# Patient Record
Sex: Male | Born: 1970 | Race: White | Hispanic: No | Marital: Married | Smoking: Former smoker
Health system: Southern US, Community
[De-identification: ages and names within clinical notes are randomized; demographics above are authoritative.]

## PROBLEM LIST (undated history)

## (undated) DIAGNOSIS — C189 Malignant neoplasm of colon, unspecified: Secondary | ICD-10-CM

## (undated) DIAGNOSIS — K219 Gastro-esophageal reflux disease without esophagitis: Secondary | ICD-10-CM

## (undated) DIAGNOSIS — I10 Essential (primary) hypertension: Secondary | ICD-10-CM

## (undated) DIAGNOSIS — R569 Unspecified convulsions: Secondary | ICD-10-CM

## (undated) HISTORY — DX: Unspecified convulsions: R56.9

## (undated) HISTORY — DX: Essential (primary) hypertension: I10

## (undated) HISTORY — PX: COLONOSCOPY: SHX174

## (undated) HISTORY — DX: Malignant neoplasm of colon, unspecified: C18.9

## (undated) HISTORY — PX: TONSILLECTOMY: SUR1361

---

## 2012-07-31 ENCOUNTER — Encounter: Payer: Self-pay | Admitting: *Deleted

## 2012-08-19 ENCOUNTER — Ambulatory Visit (INDEPENDENT_AMBULATORY_CARE_PROVIDER_SITE_OTHER): Payer: BC Managed Care – PPO | Admitting: Family Medicine

## 2012-08-19 ENCOUNTER — Encounter: Payer: Self-pay | Admitting: Family Medicine

## 2012-08-19 VITALS — BP 126/90 | HR 86 | Temp 98.3°F | Resp 18 | Wt 274.0 lb

## 2012-08-19 DIAGNOSIS — E669 Obesity, unspecified: Secondary | ICD-10-CM

## 2012-08-19 DIAGNOSIS — I10 Essential (primary) hypertension: Secondary | ICD-10-CM

## 2012-08-19 LAB — LIPID PANEL
Cholesterol: 191 mg/dL (ref 0–200)
HDL: 40 mg/dL (ref >39)
LDL Cholesterol: 127 mg/dL — ABNORMAL HIGH (ref 0–99)
Total CHOL/HDL Ratio: 4.8 Ratio
Triglycerides: 122 mg/dL (ref <150)
VLDL: 24 mg/dL (ref 0–40)

## 2012-08-19 LAB — BASIC METABOLIC PANEL
BUN: 15 mg/dL (ref 6–23)
Chloride: 103 mEq/L (ref 96–112)
Creat: 1.09 mg/dL (ref 0.50–1.35)

## 2012-08-19 LAB — HEPATIC FUNCTION PANEL
Bilirubin, Direct: 0.1 mg/dL (ref 0.0–0.3)
Indirect Bilirubin: 0.4 mg/dL (ref 0.0–0.9)
Total Bilirubin: 0.5 mg/dL (ref 0.3–1.2)

## 2012-08-19 NOTE — Progress Notes (Signed)
Subjective:     Patient ID: Russell Novak, male   DOB: September 28, 1970, 42 y.o.   MRN: [id number]  HPI  42 year old male here for followup for hypertension.  Early taking losartan 50 mg by mouth daily.  He is not checking his blood pressures at home.  He denies any chest pain shortness breath or dyspnea on exertion.   Review of Systems  Constitutional: Negative.   HENT: Negative.   Eyes: Negative.   Respiratory: Negative.   Cardiovascular: Negative.   Gastrointestinal: Negative.        Objective:   Physical Exam  Constitutional: He appears well-developed and well-nourished.  HENT:  Head: Normocephalic and atraumatic.  Eyes: Conjunctivae and EOM are normal. Pupils are equal, round, and reactive to light.  Neck: Normal range of motion. Neck supple. No thyromegaly present.  Cardiovascular: Normal rate, regular rhythm and normal heart sounds.   No murmur heard. Pulmonary/Chest: Effort normal and breath sounds normal.  Abdominal: Soft. Bowel sounds are normal. He exhibits no distension.  Skin: Skin is warm and dry.       Assessment:     Hypertension  obesity    Plan:  1. Essential hypertension, benign He's checked his blood pressures everyday for 2-3 weeks. His blood pressures greater than 140 systolic or 90 diastolic we'll increase losartan 100 mg by mouth daily.  7 dull LDL of less than 1:30 this individual I will check a fasting lipid panel to assess.  Therapeutic lifestyle changes including diet and exercise were discussed. - Basic Metabolic Panel - Hepatic Function Panel - Lipid Panel

## 2012-08-19 NOTE — Patient Instructions (Addendum)
Check BP daily using an OMRON cuff.  Report me the values in 2-3 weeks.  We will need to increase meds if BP>140/90.Hypertension As your heart beats, it forces blood through your arteries. This force is your blood pressure. If the pressure is too high, it is called hypertension (HTN) or high blood pressure. HTN is dangerous because you may have it and not know it. High blood pressure may mean that your heart has to work harder to pump blood. Your arteries may be narrow or stiff. The extra work puts you at risk for heart disease, stroke, and other problems.  Blood pressure consists of two numbers, a higher number over a lower, 110/72, for example. It is stated as "110 over 72." The ideal is below 120 for the top number (systolic) and under 80 for the bottom (diastolic). Write down your blood pressure today. You should pay close attention to your blood pressure if you have certain conditions such as:  Heart failure.  Prior heart attack.  Diabetes  Chronic kidney disease.  Prior stroke.  Multiple risk factors for heart disease. To see if you have HTN, your blood pressure should be measured while you are seated with your arm held at the level of the heart. It should be measured at least twice. A one-time elevated blood pressure reading (especially in the Emergency Department) does not mean that you need treatment. There may be conditions in which the blood pressure is different between your right and left arms. It is important to see your caregiver soon for a recheck. Most people have essential hypertension which means that there is not a specific cause. This type of high blood pressure may be lowered by changing lifestyle factors such as:  Stress.  Smoking.  Lack of exercise.  Excessive weight.  Drug/tobacco/alcohol use.  Eating less salt. Most people do not have symptoms from high blood pressure until it has caused damage to the body. Effective treatment can often prevent, delay or reduce  that damage. TREATMENT  When a cause has been identified, treatment for high blood pressure is directed at the cause. There are a large number of medications to treat HTN. These fall into several categories, and your caregiver will help you select the medicines that are best for you. Medications may have side effects. You should review side effects with your caregiver. If your blood pressure stays high after you have made lifestyle changes or started on medicines,   Your medication(s) may need to be changed.  Other problems may need to be addressed.  Be certain you understand your prescriptions, and know how and when to take your medicine.  Be sure to follow up with your caregiver within the time frame advised (usually within two weeks) to have your blood pressure rechecked and to review your medications.  If you are taking more than one medicine to lower your blood pressure, make sure you know how and at what times they should be taken. Taking two medicines at the same time can result in blood pressure that is too low. SEEK IMMEDIATE MEDICAL CARE IF:  You develop a severe headache, blurred or changing vision, or confusion.  You have unusual weakness or numbness, or a faint feeling.  You have severe chest or abdominal pain, vomiting, or breathing problems. MAKE SURE YOU:   Understand these instructions.  Will watch your condition.  Will get help right away if you are not doing well or get worse. Document Released: 05/18/2005 Document Revised: 08/10/2011 Document Reviewed:  01/06/2008 Exit[employer] Patient Information 2013 [place], [place].

## 2012-08-23 ENCOUNTER — Encounter: Payer: Self-pay | Admitting: Family Medicine

## 2012-08-29 ENCOUNTER — Telehealth: Payer: Self-pay | Admitting: Family Medicine

## 2012-08-29 MED ORDER — LOSARTAN POTASSIUM 50 MG PO TABS: 50.0000 mg | ORAL_TABLET | Freq: Every day | ORAL | Status: DC

## 2012-08-29 NOTE — Telephone Encounter (Signed)
rx refilled.

## 2012-09-29 ENCOUNTER — Encounter: Payer: Self-pay | Admitting: Family Medicine

## 2012-09-29 ENCOUNTER — Ambulatory Visit (INDEPENDENT_AMBULATORY_CARE_PROVIDER_SITE_OTHER): Payer: BC Managed Care – PPO | Admitting: Family Medicine

## 2012-09-29 VITALS — BP 120/92 | HR 74 | Temp 98.5°F | Resp 16 | Wt 267.0 lb

## 2012-09-29 DIAGNOSIS — G5602 Carpal tunnel syndrome, left upper limb: Secondary | ICD-10-CM

## 2012-09-29 DIAGNOSIS — G5600 Carpal tunnel syndrome, unspecified upper limb: Secondary | ICD-10-CM

## 2012-09-29 NOTE — Progress Notes (Signed)
  Subjective:    Patient ID: Russell Novak, male    DOB: [birth year], 42 y.o.   MRN: [id number]  HPI  Patient reports 1 year of pain in left arm. He describes numbness in his hand that extends to his midforearm. He describes dysesthesias, paresthesias, and pins and needles sensation in his hands and in his wrist. It is worse when he is driving a car or when he is sleeping at night. It is better with rest. He denies any neck pain. He denies any symptoms of cervical radiculopathy.  He denies any weakness in grip strength. He's not dropping objects. He is range of motion without pain in his elbow and his wrist, and his fingers. Past Medical History  Diagnosis Date  . Hypertension    Current Outpatient Prescriptions on File Prior to Visit  Medication Sig Dispense Refill  . losartan (COZAAR) 50 MG tablet Take 1 tablet (50 mg total) by mouth daily.  30 tablet  5  . ranitidine (ZANTAC) 150 MG tablet Take 150 mg by mouth 2 (two) times daily.       No current facility-administered medications on file prior to visit.   No Known Allergies History   Social History  . Marital Status: Married    Spouse Name: N/A    Number of Children: N/A  . Years of Education: N/A   Occupational History  . Not on file.   Social History Main Topics  . Smoking status: Former Smoker    Types: Cigarettes  . Smokeless tobacco: Not on file     Comment: quit 2005  . Alcohol Use: Yes     Comment: occasional  . Drug Use: No  . Sexually Active: Not on file   Other Topics Concern  . Not on file   Social History Narrative  . No narrative on file     Review of Systems  All other systems reviewed and are negative.       Objective:   Physical Exam  Vitals reviewed. Cardiovascular: Normal rate, regular rhythm and normal heart sounds.   No murmur heard. Pulmonary/Chest: Effort normal and breath sounds normal.  Musculoskeletal:       Left wrist: He exhibits normal range of motion, no tenderness, no  swelling, no crepitus and no deformity.   patient has a positive Tinel's sign and a positive Phalen sign at the left wrist. He is a negative Spurling sign. His reflexes are two over four at the biceps, brachioradialis, and tricep.  There is no evidence of thenar eminence atrophy.  He has normal grip strength.        Assessment & Plan:  1. Carpal tunnel syndrome, left Discussed the different therapeutic options including a cock up wrist splint, present injection left wrist, and referral to orthopedics for carpal tunnel release.  Patient would like to try the cockup wrist splint first. I instructed he needed to wear this with activity and at night. If his symptoms are no better in 2 weeks I would recommend attempting a cortisone injection in his left wrist. I be glad to do this here or with her for orthopedics based upon the patient's wishes.

## 2012-11-08 ENCOUNTER — Ambulatory Visit (INDEPENDENT_AMBULATORY_CARE_PROVIDER_SITE_OTHER): Payer: BC Managed Care – PPO | Admitting: Family Medicine

## 2012-11-08 ENCOUNTER — Encounter: Payer: Self-pay | Admitting: Family Medicine

## 2012-11-08 VITALS — BP 126/82 | HR 74 | Temp 98.5°F | Resp 18 | Wt 274.0 lb

## 2012-11-08 DIAGNOSIS — M67919 Unspecified disorder of synovium and tendon, unspecified shoulder: Secondary | ICD-10-CM

## 2012-11-08 MED ORDER — MELOXICAM 15 MG PO TABS: 15.0000 mg | ORAL_TABLET | Freq: Every day | ORAL | Status: DC

## 2012-11-08 NOTE — Progress Notes (Signed)
  Subjective:    Patient ID: Russell Novak, male    DOB: 1971-01-07, 42 y.o.   MRN: [id number]  HPI  Patient is here complaining of left shoulder pain for over a month. It hurts with internal rotation. It hurts to push off with his arm to rise from a chair.  It also hurts to raise his arm forward and hold it extended in that position.  He denies any falls or injury. He denies any pain with overhead activity. He denies any locking or crepitus in the joint. Past Medical History  Diagnosis Date  . Hypertension    Current Outpatient Prescriptions on File Prior to Visit  Medication Sig Dispense Refill  . losartan (COZAAR) 50 MG tablet Take 1 tablet (50 mg total) by mouth daily.  30 tablet  5  . ranitidine (ZANTAC) 150 MG tablet Take 150 mg by mouth 2 (two) times daily.       No current facility-administered medications on file prior to visit.   No Known Allergies History   Social History  . Marital Status: Married    Spouse Name: N/A    Number of Children: N/A  . Years of Education: N/A   Occupational History  . Not on file.   Social History Main Topics  . Smoking status: Former Smoker    Types: Cigarettes  . Smokeless tobacco: Not on file     Comment: quit 2005  . Alcohol Use: Yes     Comment: occasional  . Drug Use: No  . Sexually Active: Not on file   Other Topics Concern  . Not on file   Social History Narrative  . No narrative on file   No family history on file.   Review of Systems  All other systems reviewed and are negative.       Objective:   Physical Exam  Vitals reviewed. Cardiovascular: Normal rate, regular rhythm and normal heart sounds.   No murmur heard. Pulmonary/Chest: Effort normal and breath sounds normal.  Musculoskeletal:       Left shoulder: He exhibits normal range of motion, no bony tenderness, no swelling, no effusion, no crepitus, no spasm and normal strength.   patient has a negative empty can sign, a negative Hawkins sign,  negative O'Brien's test, he does have a positive subscapularis liftoff test.        Assessment & Plan:  1. Subscapularis tendinitis of left shoulder I suspect tendinitis in the subscapularis muscle. Mobic 15 mg 1 by mouth daily. Obtain x-ray of the left shoulder. If symptoms are not improving in one month consider cortisone injection. If symptoms worsen obtain MRI of the shoulder - meloxicam (MOBIC) 15 MG tablet; Take 1 tablet (15 mg total) by mouth daily.  Dispense: 30 tablet; Refill: 0 - DG Shoulder Left; Future

## 2012-11-09 ENCOUNTER — Ambulatory Visit
Admission: RE | Admit: 2012-11-09 | Discharge: 2012-11-09 | Disposition: A | Discharge status: Home / Self Care | Payer: BC Managed Care – PPO | Source: Ambulatory Visit | Attending: Family Medicine | Admitting: Family Medicine

## 2012-12-15 ENCOUNTER — Ambulatory Visit (INDEPENDENT_AMBULATORY_CARE_PROVIDER_SITE_OTHER): Payer: BC Managed Care – PPO | Admitting: Family Medicine

## 2012-12-15 ENCOUNTER — Encounter: Payer: Self-pay | Admitting: Family Medicine

## 2012-12-15 VITALS — BP 110/70 | HR 86 | Temp 98.3°F | Resp 18 | Wt 273.0 lb

## 2012-12-15 DIAGNOSIS — M67919 Unspecified disorder of synovium and tendon, unspecified shoulder: Secondary | ICD-10-CM

## 2012-12-15 MED ORDER — ALPRAZOLAM 0.25 MG PO TABS: 0.2500 mg | ORAL_TABLET | Freq: Two times a day (BID) | ORAL | Status: DC | PRN

## 2012-12-15 NOTE — Progress Notes (Signed)
Subjective:    Patient ID: Russell Novak, male    DOB: 11-Jan-1971, 42 y.o.   MRN: [id number]  HPI  11/17/12 Patient is here complaining of left shoulder pain for over a month. It hurts with internal rotation. It hurts to push off with his arm to rise from a chair.  It also hurts to raise his arm forward and hold it extended in that position.  He denies any falls or injury. He denies any pain with overhead activity. He denies any locking or crepitus in the joint.  At that time, my plan was: 1. Subscapularis tendinitis of left shoulder I suspect tendinitis in the subscapularis muscle. Mobic 15 mg 1 by mouth daily. Obtain x-ray of the left shoulder. If symptoms are not improving in one month consider cortisone injection. If symptoms worsen obtain MRI of the shoulder - meloxicam (MOBIC) 15 MG tablet; Take 1 tablet (15 mg total) by mouth daily.  Dispense: 30 tablet; Refill: 0 - DG Shoulder Left; Future  The shoulder x-ray was negative.  However his shoulder pain is no better on meloxicam.  He continues to hurt to push off of the chair. He continues to have pain in the subscapularis liftoff testing. He also has constant aching pain in the subacromial space.  He also may have an element of biceps tendinitis. Past Medical History  Diagnosis Date  . Hypertension    Current Outpatient Prescriptions on File Prior to Visit  Medication Sig Dispense Refill  . losartan (COZAAR) 50 MG tablet Take 1 tablet (50 mg total) by mouth daily.  30 tablet  5  . ranitidine (ZANTAC) 150 MG tablet Take 150 mg by mouth 2 (two) times daily.       No current facility-administered medications on file prior to visit.   No Known Allergies History   Social History  . Marital Status: Married    Spouse Name: N/A    Number of Children: N/A  . Years of Education: N/A   Occupational History  . Not on file.   Social History Main Topics  . Smoking status: Former Smoker    Types: Cigarettes  . Smokeless tobacco: Not  on file     Comment: quit 2005  . Alcohol Use: Yes     Comment: occasional  . Drug Use: No  . Sexually Active: Not on file   Other Topics Concern  . Not on file   Social History Narrative  . No narrative on file   No family history on file.   Review of Systems  All other systems reviewed and are negative.       Objective:   Physical Exam  Vitals reviewed. Cardiovascular: Normal rate, regular rhythm and normal heart sounds.   No murmur heard. Pulmonary/Chest: Effort normal and breath sounds normal.  Musculoskeletal:       Left shoulder: He exhibits normal range of motion, no bony tenderness, no swelling, no effusion, no crepitus, no spasm and normal strength.   patient has a negative empty can sign, a negative Hawkins sign, negative O'Brien's test, he does have a positive subscapularis liftoff test.        Assessment & Plan:  1. Subscapularis tendinitis of left shoulder I gave the patient the option of an MRI versus physical therapy versus a cortisone shot. He chooses the cortisone injection.  Using sterile technique a combination of 2 cc Marcaine, 2 cc of 0.1% lidocaine, and 2 cc of 40 mg per mL Kenalog was injected into the left  subacromial space without complication. If pain is not improved I would proceed with MRI.

## 2013-02-28 ENCOUNTER — Telehealth: Payer: Self-pay | Admitting: Family Medicine

## 2013-02-28 MED ORDER — LOSARTAN POTASSIUM 50 MG PO TABS: 50.0000 mg | ORAL_TABLET | Freq: Every day | ORAL | Status: DC

## 2013-02-28 NOTE — Telephone Encounter (Signed)
Losartan Potassium 50 mg tab 1 QD #30 °

## 2013-02-28 NOTE — Telephone Encounter (Signed)
Rx Refilled  

## 2013-05-12 ENCOUNTER — Telehealth: Payer: Self-pay | Admitting: Family Medicine

## 2013-05-12 NOTE — Telephone Encounter (Signed)
Faxed by [facility]

## 2013-05-12 NOTE — Telephone Encounter (Signed)
Pt wife is calling because her husband is still having problems out of his shoulder and she has made him an apt to see Dr Rennis Chris on the 17th and they are wanting the notes from the visit and the wife gave the fax number to [place]: Fax number [phone] Call back number is [phone]

## 2013-06-19 ENCOUNTER — Ambulatory Visit (HOSPITAL_COMMUNITY)
Admission: RE | Admit: 2013-06-19 | Discharge: 2013-06-19 | Disposition: A | Discharge status: Home / Self Care | Payer: BC Managed Care – PPO | Source: Ambulatory Visit | Attending: Orthopedic Surgery | Admitting: Orthopedic Surgery

## 2013-06-19 ENCOUNTER — Other Ambulatory Visit (HOSPITAL_COMMUNITY): Payer: Self-pay | Admitting: Orthopedic Surgery

## 2013-06-19 DIAGNOSIS — R52 Pain, unspecified: Secondary | ICD-10-CM

## 2013-06-19 DIAGNOSIS — Z1389 Encounter for screening for other disorder: Secondary | ICD-10-CM | POA: Insufficient documentation

## 2013-07-04 ENCOUNTER — Encounter: Payer: Self-pay | Admitting: Family Medicine

## 2013-07-04 ENCOUNTER — Ambulatory Visit (INDEPENDENT_AMBULATORY_CARE_PROVIDER_SITE_OTHER): Payer: BC Managed Care – PPO | Admitting: Family Medicine

## 2013-07-04 VITALS — BP 138/94 | HR 74 | Temp 97.6°F | Ht 68.0 in | Wt 277.0 lb

## 2013-07-04 DIAGNOSIS — I10 Essential (primary) hypertension: Secondary | ICD-10-CM

## 2013-07-04 MED ORDER — LOSARTAN POTASSIUM-HCTZ 50-12.5 MG PO TABS: 1.0000 | ORAL_TABLET | Freq: Every day | ORAL | Status: DC

## 2013-07-04 NOTE — Progress Notes (Signed)
   Subjective:    Patient ID: Russell Novak, male    DOB: Jan 19, 1971, 43 y.o.   MRN: [id number]  HPI Patient has a history of hypertension. He is currently taking losartan 50 mg by mouth daily. His blood pressure home as been ranging 140-160/80-90. This is giving him a dull headache and on a daily basis and some lightheadedness. He denies any chest pain shortness of breath or dyspnea on exertion. Past Medical History  Diagnosis Date  . Hypertension    Current Outpatient Prescriptions on File Prior to Visit  Medication Sig Dispense Refill  . losartan (COZAAR) 50 MG tablet Take 1 tablet (50 mg total) by mouth daily.  30 tablet  5  . ranitidine (ZANTAC) 150 MG tablet Take 150 mg by mouth 2 (two) times daily.       No current facility-administered medications on file prior to visit.   No Known Allergies History   Social History  . Marital Status: Married    Spouse Name: N/A    Number of Children: N/A  . Years of Education: N/A   Occupational History  . Not on file.   Social History Main Topics  . Smoking status: Former Smoker    Types: Cigarettes  . Smokeless tobacco: Not on file     Comment: quit 2005  . Alcohol Use: Yes     Comment: occasional  . Drug Use: No  . Sexual Activity: Not on file   Other Topics Concern  . Not on file   Social History Narrative  . No narrative on file      Review of Systems  All other systems reviewed and are negative.       Objective:   Physical Exam  Vitals reviewed. Constitutional: He is oriented to person, place, and time.  Cardiovascular: Normal rate, regular rhythm and normal heart sounds.   No murmur heard. Pulmonary/Chest: Effort normal and breath sounds normal. No respiratory distress. He has no wheezes. He has no rales.  Neurological: He is alert and oriented to person, place, and time. No cranial nerve deficit. He exhibits normal muscle tone. Coordination normal.          Assessment & Plan:  1. HTN  (hypertension) Discontinue losartan and replaced with Hyzaar 50/12.5 one by mouth daily. Recheck blood pressure in 2 weeks.  Return immediately if headache worsens. - losartan-hydrochlorothiazide (HYZAAR) 50-12.5 MG per tablet; Take 1 tablet by mouth daily.  Dispense: 90 tablet; Refill: 3

## 2014-02-09 ENCOUNTER — Encounter: Payer: Self-pay | Admitting: Family Medicine

## 2014-02-09 ENCOUNTER — Ambulatory Visit (INDEPENDENT_AMBULATORY_CARE_PROVIDER_SITE_OTHER): Payer: BC Managed Care – PPO | Admitting: Family Medicine

## 2014-02-09 VITALS — BP 126/80 | HR 64 | Temp 98.2°F | Resp 16 | Wt 272.0 lb

## 2014-02-09 DIAGNOSIS — L03213 Periorbital cellulitis: Secondary | ICD-10-CM

## 2014-02-09 DIAGNOSIS — H00039 Abscess of eyelid unspecified eye, unspecified eyelid: Secondary | ICD-10-CM

## 2014-02-09 MED ORDER — SULFAMETHOXAZOLE-TMP DS 800-160 MG PO TABS: 1.0000 | ORAL_TABLET | Freq: Two times a day (BID) | ORAL | Status: DC

## 2014-02-09 NOTE — Progress Notes (Signed)
   Subjective:    Patient ID: Russell Novak, male    DOB: 1970/06/14, 43 y.o.   MRN: [id number]  HPI Beginning Wednesday night, the patient began developing pain in his right upper eyelid. The right upper eyelid is not erythematous warm swollen and extremely tender. He also has some erythema and swelling underneath his right eyebrow. There is also some mild erythema beginning to spread down to his upper right cheek. He has no evidence of pain with extraocular movement. There is no erythema or injection of the conjunctiva. Are equal round reactive to light. There is no evidence of orbital cellulitis. There is no fluctuance or obvious abscess that needs to be drained. Past Medical History  Diagnosis Date  . Hypertension    Current Outpatient Prescriptions on File Prior to Visit  Medication Sig Dispense Refill  . losartan (COZAAR) 50 MG tablet Take 1 tablet (50 mg total) by mouth daily.  30 tablet  5  . losartan-hydrochlorothiazide (HYZAAR) 50-12.5 MG per tablet Take 1 tablet by mouth daily.  90 tablet  3  . ranitidine (ZANTAC) 150 MG tablet Take 150 mg by mouth 2 (two) times daily.       No current facility-administered medications on file prior to visit.   No Known Allergies History   Social History  . Marital Status: Married    Spouse Name: N/A    Number of Children: N/A  . Years of Education: N/A   Occupational History  . Not on file.   Social History Main Topics  . Smoking status: Former Smoker    Types: Cigarettes  . Smokeless tobacco: Not on file     Comment: quit 2005  . Alcohol Use: Yes     Comment: occasional  . Drug Use: No  . Sexual Activity: Not on file   Other Topics Concern  . Not on file   Social History Narrative  . No narrative on file        Review of Systems  All other systems reviewed and are negative.      Objective:   Physical Exam  Eyes: Lids are everted and swept, no foreign bodies found. Right eye exhibits no discharge, no exudate and  no hordeolum. Left eye exhibits no discharge, no exudate and no hordeolum.   right eyelid is exquisitely erythematous tender and swollen. Erythema is extending underneath right eyebrow and also down to the right cheek.          Assessment & Plan:  Preseptal cellulitis of right eye - Plan: sulfamethoxazole-trimethoprim (BACTRIM DS) 800-160 MG per tablet  begin Bactrim one tablet by mouth twice a day for 7-10 days. Use warm compresses 3 times a day. If the infection is worsening, or if he develops pain with extraocular movement, I recommend he go to the emergency room for an urgent CT scan to rule out orbital cellulitis. Otherwise followup next week.

## 2014-06-21 ENCOUNTER — Ambulatory Visit (INDEPENDENT_AMBULATORY_CARE_PROVIDER_SITE_OTHER): Payer: BC Managed Care – PPO | Admitting: Family Medicine

## 2014-06-21 ENCOUNTER — Encounter: Payer: Self-pay | Admitting: Family Medicine

## 2014-06-21 VITALS — BP 132/84 | HR 68 | Temp 98.5°F | Resp 16 | Ht 68.0 in | Wt 280.0 lb

## 2014-06-21 DIAGNOSIS — H53132 Sudden visual loss, left eye: Secondary | ICD-10-CM

## 2014-06-21 LAB — COMPLETE METABOLIC PANEL WITH GFR
ALBUMIN: 4.2 g/dL (ref 3.5–5.2)
ALT: 61 U/L — AB (ref 0–53)
AST: 32 U/L (ref 0–37)
Alkaline Phosphatase: 52 U/L (ref 39–117)
BUN: 16 mg/dL (ref 6–23)
CALCIUM: 9.9 mg/dL (ref 8.4–10.5)
CO2: 29 mEq/L (ref 19–32)
Chloride: 102 mEq/L (ref 96–112)
Creat: 1.06 mg/dL (ref 0.50–1.35)
GFR, Est Non African American: 84 mL/min
GLUCOSE: 107 mg/dL — AB (ref 70–99)
Potassium: 5 mEq/L (ref 3.5–5.3)
Sodium: 139 mEq/L (ref 135–145)
TOTAL PROTEIN: 6.5 g/dL (ref 6.0–8.3)
Total Bilirubin: 0.4 mg/dL (ref 0.2–1.2)

## 2014-06-21 LAB — HEMOGLOBIN A1C
Hgb A1c MFr Bld: 5.8 % — ABNORMAL HIGH (ref <5.7)
MEAN PLASMA GLUCOSE: 120 mg/dL — AB (ref <117)

## 2014-06-21 LAB — LIPID PANEL
CHOL/HDL RATIO: 4.3 ratio
Cholesterol: 192 mg/dL (ref 0–200)
HDL: 45 mg/dL (ref >39)
LDL Cholesterol: 128 mg/dL — ABNORMAL HIGH (ref 0–99)
Triglycerides: 97 mg/dL (ref <150)
VLDL: 19 mg/dL (ref 0–40)

## 2014-06-21 NOTE — Progress Notes (Signed)
Subjective:    Patient ID: Russell Novak, male    DOB: 1971/04/12, 44 y.o.   MRN: [id number]  HPI Patient has been having blurry vision for the last several months. He has been seeing an eye doctor..Doctor did not find any problems with his eyes. Recently he had a transient episode where the left hemisphere of vision in his left eye became suddenly blurry. This lasted approximately one hour and then resolved on its sign. There was no headache. There is no other neurologic deficit. There was no head trauma. There was no loss of consciousness. There was no slurring of speech, facial droop, or evidence of stroke. On funduscopic exam today there is no evidence of retinopathy. Patient denies any cotton-wool spots or descending curtain in his field of vision.  He does have a history of migraines. He does have occasional pulsatile headaches with photophobia. Past Medical History  Diagnosis Date  . Hypertension    No past surgical history on file. Current Outpatient Prescriptions on File Prior to Visit  Medication Sig Dispense Refill  . losartan-hydrochlorothiazide (HYZAAR) 50-12.5 MG per tablet Take 1 tablet by mouth daily. 90 tablet 3  . ranitidine (ZANTAC) 150 MG tablet Take 150 mg by mouth 2 (two) times daily.     No current facility-administered medications on file prior to visit.   No Known Allergies History   Social History  . Marital Status: Married    Spouse Name: N/A    Number of Children: N/A  . Years of Education: N/A   Occupational History  . Not on file.   Social History Main Topics  . Smoking status: Former Smoker    Types: Cigarettes  . Smokeless tobacco: Not on file     Comment: quit 2005  . Alcohol Use: Yes     Comment: occasional  . Drug Use: No  . Sexual Activity: Not on file   Other Topics Concern  . Not on file   Social History Narrative      Review of Systems  All other systems reviewed and are negative.      Objective:   Physical Exam    Constitutional: He is oriented to person, place, and time. He appears well-developed and well-nourished.  Eyes: Conjunctivae and EOM are normal. Pupils are equal, round, and reactive to light.  Fundoscopic exam:      The right eye shows no arteriolar narrowing, no AV nicking, no exudate, no hemorrhage and no papilledema.       The left eye shows no arteriolar narrowing, no AV nicking, no exudate, no hemorrhage and no papilledema.  Cardiovascular: Normal rate, regular rhythm and normal heart sounds.   Pulmonary/Chest: Effort normal and breath sounds normal.  Neurological: He is alert and oriented to person, place, and time. He has normal reflexes. He displays normal reflexes. No cranial nerve deficit. He exhibits normal muscle tone. Coordination normal.  Vitals reviewed.         Assessment & Plan:  Vision, loss, sudden, left - Plan: COMPLETE METABOLIC PANEL WITH GFR, Hemoglobin A1c, Lipid panel, Carotid duplex  Patient's exam today is completely normal. I will check lab work to evaluate for diabetes as well as his cholesterol. His blood pressures well controlled. Differential diagnosis includes embolic stroke to the eyeball, TIA, ocular migraine, or retinal pathology. I do not suspect retinal pathology as his funduscopic exam today appears normal. I did recommend he get a formal exam with his ophthalmologist. I will also check carotid Dopplers to rule  out embolic sources although I feel this is unlikely. The leading suspicion on my differential is an ocular migraine.

## 2014-06-25 ENCOUNTER — Encounter: Payer: Self-pay | Admitting: Family Medicine

## 2014-06-26 ENCOUNTER — Ambulatory Visit (HOSPITAL_COMMUNITY): Payer: BC Managed Care – PPO | Attending: Cardiology | Admitting: Cardiology

## 2014-06-26 DIAGNOSIS — H53132 Sudden visual loss, left eye: Secondary | ICD-10-CM | POA: Insufficient documentation

## 2014-06-26 DIAGNOSIS — H539 Unspecified visual disturbance: Secondary | ICD-10-CM | POA: Diagnosis present

## 2014-06-26 NOTE — Progress Notes (Signed)
Carotid duplex performed 

## 2014-07-03 ENCOUNTER — Other Ambulatory Visit: Payer: Self-pay | Admitting: Family Medicine

## 2015-03-25 ENCOUNTER — Ambulatory Visit (INDEPENDENT_AMBULATORY_CARE_PROVIDER_SITE_OTHER): Payer: BC Managed Care – PPO | Admitting: Family Medicine

## 2015-03-25 ENCOUNTER — Encounter: Payer: Self-pay | Admitting: Family Medicine

## 2015-03-25 VITALS — BP 156/88 | HR 64 | Temp 98.4°F | Resp 18 | Ht 68.0 in | Wt 279.0 lb

## 2015-03-25 DIAGNOSIS — M545 Low back pain: Secondary | ICD-10-CM | POA: Diagnosis not present

## 2015-03-25 MED ORDER — TIZANIDINE HCL 4 MG PO CAPS: 4.0000 mg | ORAL_CAPSULE | Freq: Three times a day (TID) | ORAL | Status: DC | PRN

## 2015-03-25 MED ORDER — PREDNISONE 20 MG PO TABS: ORAL_TABLET | ORAL | Status: DC

## 2015-03-25 NOTE — Progress Notes (Signed)
   Subjective:    Patient ID: Russell Novak, male    DOB: 06-Apr-1971, 44 y.o.   MRN: [id number]  HPI 1 week ago, the patient felt something slip in his right side of his lower back. Ever since that time he has had intense right-sided lower back pain. He denies any numbness or tingling in his right leg. He denies any weakness in his leg. He denies any symptoms of cauda equina syndrome. He denies any falls or recent injury. He is tender to palpation in the right lower lumbar paraspinal muscles. He has normal reflexes. He has normal 5 over 5 muscle strength in both legs. He has a negative straight leg raise bilaterally Past Medical History  Diagnosis Date  . Hypertension    No past surgical history on file. Current Outpatient Prescriptions on File Prior to Visit  Medication Sig Dispense Refill  . losartan-hydrochlorothiazide (HYZAAR) 50-12.5 MG per tablet TAKE 1 TABLET BY MOUTH DAILY. 90 tablet 3  . ranitidine (ZANTAC) 150 MG tablet Take 150 mg by mouth 2 (two) times daily.     No current facility-administered medications on file prior to visit.   No Known Allergies Social History   Social History  . Marital Status: Married    Spouse Name: N/A  . Number of Children: N/A  . Years of Education: N/A   Occupational History  . Not on file.   Social History Main Topics  . Smoking status: Former Smoker    Types: Cigarettes  . Smokeless tobacco: Not on file     Comment: quit 2005  . Alcohol Use: Yes     Comment: occasional  . Drug Use: No  . Sexual Activity: Not on file   Other Topics Concern  . Not on file   Social History Narrative      Review of Systems  All other systems reviewed and are negative.      Objective:   Physical Exam  Cardiovascular: Normal rate, regular rhythm and normal heart sounds.   Pulmonary/Chest: Effort normal and breath sounds normal. No respiratory distress. He has no wheezes. He has no rales.  Musculoskeletal:       Lumbar back: He exhibits  decreased range of motion, tenderness, pain and spasm. He exhibits no bony tenderness.  Vitals reviewed.         Assessment & Plan:  Low back pain without sciatica, unspecified back pain laterality - Plan: predniSONE (DELTASONE) 20 MG tablet, tiZANidine (ZANAFLEX) 4 MG capsule  I believe the patient has strained a lumbar paraspinal muscle or possibly herniated disc in his lower back. Begin by treating it with a prednisone taper pack along with Zanaflex 4 mg every 6 hours as needed for muscle spasms. Recheck in one week

## 2015-05-27 ENCOUNTER — Other Ambulatory Visit: Payer: Self-pay | Admitting: Family Medicine

## 2015-05-28 NOTE — Telephone Encounter (Signed)
Prescription sent to pharmacy.

## 2015-05-28 NOTE — Telephone Encounter (Signed)
ok 

## 2015-05-28 NOTE — Telephone Encounter (Signed)
Ok to refill 

## 2015-07-11 ENCOUNTER — Other Ambulatory Visit: Payer: Self-pay | Admitting: *Deleted

## 2015-07-11 ENCOUNTER — Other Ambulatory Visit: Payer: Self-pay | Admitting: Family Medicine

## 2015-07-11 MED ORDER — LOSARTAN POTASSIUM-HCTZ 50-12.5 MG PO TABS: 1.0000 | ORAL_TABLET | Freq: Every day | ORAL | Status: DC

## 2015-07-11 NOTE — Telephone Encounter (Signed)
Received fax requesting refill on Losartan/ HCTZ.   Refill appropriate and filled per protocol. 

## 2015-07-11 NOTE — Telephone Encounter (Signed)
Refill appropriate and filled per protocol. 

## 2015-10-09 ENCOUNTER — Other Ambulatory Visit: Payer: Self-pay | Admitting: Family Medicine

## 2015-10-13 ENCOUNTER — Other Ambulatory Visit: Payer: Self-pay | Admitting: Family Medicine

## 2016-12-30 ENCOUNTER — Other Ambulatory Visit: Payer: Self-pay | Admitting: Family Medicine

## 2017-06-01 DIAGNOSIS — C189 Malignant neoplasm of colon, unspecified: Secondary | ICD-10-CM

## 2017-06-01 HISTORY — DX: Malignant neoplasm of colon, unspecified: C18.9

## 2017-11-09 ENCOUNTER — Other Ambulatory Visit: Payer: Self-pay | Admitting: Family Medicine

## 2017-11-09 MED ORDER — LOSARTAN POTASSIUM-HCTZ 50-12.5 MG PO TABS: 1.0000 | ORAL_TABLET | Freq: Every day | ORAL | 0 refills | Status: DC

## 2017-11-10 ENCOUNTER — Other Ambulatory Visit: Payer: Self-pay | Admitting: Family Medicine

## 2017-11-18 ENCOUNTER — Encounter: Payer: Self-pay | Admitting: Gastroenterology

## 2017-11-18 ENCOUNTER — Ambulatory Visit (INDEPENDENT_AMBULATORY_CARE_PROVIDER_SITE_OTHER): Payer: BC Managed Care – PPO | Admitting: Family Medicine

## 2017-11-18 ENCOUNTER — Encounter: Payer: Self-pay | Admitting: Family Medicine

## 2017-11-18 VITALS — BP 126/84 | HR 70 | Temp 98.4°F | Resp 16 | Ht 68.0 in | Wt 260.0 lb

## 2017-11-18 DIAGNOSIS — Z23 Encounter for immunization: Secondary | ICD-10-CM | POA: Diagnosis not present

## 2017-11-18 DIAGNOSIS — I10 Essential (primary) hypertension: Secondary | ICD-10-CM | POA: Diagnosis not present

## 2017-11-18 DIAGNOSIS — K625 Hemorrhage of anus and rectum: Secondary | ICD-10-CM

## 2017-11-18 DIAGNOSIS — Z0000 Encounter for general adult medical examination without abnormal findings: Secondary | ICD-10-CM | POA: Diagnosis not present

## 2017-11-18 DIAGNOSIS — E669 Obesity, unspecified: Secondary | ICD-10-CM

## 2017-11-18 LAB — CBC WITH DIFFERENTIAL/PLATELET
BASOS ABS: 52 {cells}/uL (ref 0–200)
Basophils Relative: 0.7 %
EOS ABS: 348 {cells}/uL (ref 15–500)
EOS PCT: 4.7 %
HCT: 46.3 % (ref 38.5–50.0)
Hemoglobin: 15.9 g/dL (ref 13.2–17.1)
Lymphs Abs: 2627 cells/uL (ref 850–3900)
MCH: 28.3 pg (ref 27.0–33.0)
MCHC: 34.3 g/dL (ref 32.0–36.0)
MCV: 82.4 fL (ref 80.0–100.0)
MONOS PCT: 7 %
MPV: 11 fL (ref 7.5–12.5)
NEUTROS PCT: 52.1 %
Neutro Abs: 3855 cells/uL (ref 1500–7800)
PLATELETS: 270 10*3/uL (ref 140–400)
RBC: 5.62 10*6/uL (ref 4.20–5.80)
RDW: 13.6 % (ref 11.0–15.0)
TOTAL LYMPHOCYTE: 35.5 %
WBC mixed population: 518 cells/uL (ref 200–950)
WBC: 7.4 10*3/uL (ref 3.8–10.8)

## 2017-11-18 LAB — COMPLETE METABOLIC PANEL WITH GFR
AG Ratio: 1.8 (calc) (ref 1.0–2.5)
ALKALINE PHOSPHATASE (APISO): 50 U/L (ref 40–115)
ALT: 27 U/L (ref 9–46)
AST: 19 U/L (ref 10–40)
Albumin: 4.2 g/dL (ref 3.6–5.1)
BILIRUBIN TOTAL: 0.5 mg/dL (ref 0.2–1.2)
BUN: 14 mg/dL (ref 7–25)
CHLORIDE: 106 mmol/L (ref 98–110)
CO2: 28 mmol/L (ref 20–32)
Calcium: 9.7 mg/dL (ref 8.6–10.3)
Creat: 1.05 mg/dL (ref 0.60–1.35)
GFR, Est African American: 97 mL/min/{1.73_m2} (ref >=60)
GFR, Est Non African American: 84 mL/min/{1.73_m2} (ref >=60)
GLUCOSE: 113 mg/dL — AB (ref 65–99)
Globulin: 2.4 g/dL (calc) (ref 1.9–3.7)
Potassium: 4.4 mmol/L (ref 3.5–5.3)
Sodium: 141 mmol/L (ref 135–146)
Total Protein: 6.6 g/dL (ref 6.1–8.1)

## 2017-11-18 LAB — LIPID PANEL
CHOLESTEROL: 201 mg/dL — AB (ref <200)
HDL: 45 mg/dL (ref >40)
LDL CHOLESTEROL (CALC): 135 mg/dL — AB
Non-HDL Cholesterol (Calc): 156 mg/dL (calc) — ABNORMAL HIGH (ref <130)
Total CHOL/HDL Ratio: 4.5 (calc) (ref <5.0)
Triglycerides: 100 mg/dL (ref <150)

## 2017-11-18 NOTE — Addendum Note (Signed)
Addended by: Shary Decamp B on: 11/18/2017 12:33 PM   Modules accepted: Orders

## 2017-11-18 NOTE — Progress Notes (Signed)
Subjective:    Patient ID: Russell Novak, male    DOB: January 13, 1971, 47 y.o.   MRN: [id number]  HPI  Patient is a very pleasant 47 year old Caucasian male here today for complete physical exam.  He has no concerns.  However, during the course of our review of systems, the patient did report that he is experiencing bright red blood per rectum.  He states that this is gone on for several months.  Occasionally will happen several days in a row.  It is usually not associated with painful bowel movements or any palpable perirectal mass or hemorrhoid.  He will see painless blood on the toilet tissue.  He has not seen any blood in over a month however this is consistent and has been happening off and on throughout the year.  There is no family history of colon cancer.  There is no family history of prostate cancer.  He is currently taking his antihypertensive medication and his blood pressure is well controlled.  Past Medical History:  Diagnosis Date  . Hypertension    No past surgical history on file. Current Outpatient Medications on File Prior to Visit  Medication Sig Dispense Refill  . hydrochlorothiazide (HYDRODIURIL) 12.5 MG tablet TAKE 1 TABLET BY MOUTH EVERY DAY 30 tablet 0  . losartan (COZAAR) 50 MG tablet TAKE 1 TABLET BY MOUTH EVERY DAY 30 tablet 0  . ranitidine (ZANTAC) 150 MG tablet Take 150 mg by mouth 2 (two) times daily.     No current facility-administered medications on file prior to visit.    No Known Allergies Social History   Socioeconomic History  . Marital status: Married    Spouse name: Not on file  . Number of children: Not on file  . Years of education: Not on file  . Highest education level: Not on file  Occupational History  . Not on file  Social Needs  . Financial resource strain: Not on file  . Food insecurity:    Worry: Not on file    Inability: Not on file  . Transportation needs:    Medical: Not on file    Non-medical: Not on file  Tobacco Use  .  Smoking status: Former Smoker    Types: Cigarettes  . Smokeless tobacco: Never Used  . Tobacco comment: quit 2005  Substance and Sexual Activity  . Alcohol use: Yes    Comment: occasional  . Drug use: No  . Sexual activity: Not on file  Lifestyle  . Physical activity:    Days per week: Not on file    Minutes per session: Not on file  . Stress: Not on file  Relationships  . Social connections:    Talks on phone: Not on file    Gets together: Not on file    Attends religious service: Not on file    Active member of club or organization: Not on file    Attends meetings of clubs or organizations: Not on file    Relationship status: Not on file  . Intimate partner violence:    Fear of current or ex partner: Not on file    Emotionally abused: Not on file    Physically abused: Not on file    Forced sexual activity: Not on file  Other Topics Concern  . Not on file  Social History Narrative  . Not on file   No family history on file.' He does have a cousin who was recently diagnosed with prostate cancer. Review of Systems  Gastrointestinal: Positive for anal bleeding and blood in stool.  All other systems reviewed and are negative.      Objective:   Physical Exam  Constitutional: He is oriented to person, place, and time. He appears well-developed and well-nourished. No distress.  HENT:  Head: Normocephalic and atraumatic.  Right Ear: External ear normal.  Left Ear: External ear normal.  Nose: Nose normal.  Mouth/Throat: Oropharynx is clear and moist. No oropharyngeal exudate.  Eyes: Pupils are equal, round, and reactive to light. Conjunctivae and EOM are normal. Right eye exhibits no discharge. Left eye exhibits no discharge. No scleral icterus.  Neck: Normal range of motion. Neck supple. No JVD present. No tracheal deviation present. No thyromegaly present.  Cardiovascular: Normal rate, regular rhythm, normal heart sounds and intact distal pulses. Exam reveals no gallop  and no friction rub.  No murmur heard. Pulmonary/Chest: Effort normal and breath sounds normal. No stridor. No respiratory distress. He has no wheezes. He has no rales. He exhibits no tenderness.  Abdominal: Soft. Bowel sounds are normal. He exhibits no distension and no mass. There is no tenderness. There is no guarding.  Genitourinary: Penis normal.  Musculoskeletal: Normal range of motion. He exhibits no edema, tenderness or deformity.  Lymphadenopathy:    He has no cervical adenopathy.  Neurological: He is alert and oriented to person, place, and time. He displays normal reflexes. No cranial nerve deficit or sensory deficit. He exhibits normal muscle tone. Coordination normal.  Skin: Skin is warm. No rash noted. He is not diaphoretic. No erythema. No pallor.  Vitals reviewed.         Assessment & Plan:  General medical exam  Obesity with serious comorbidity, unspecified classification, unspecified obesity type  Benign essential HTN - Plan: CBC with Differential/Platelet, COMPLETE METABOLIC PANEL WITH GFR, Lipid panel  BRBPR (bright red blood per rectum) - Plan: Ambulatory referral to Gastroenterology  Aside from his elevated BMI, the remainder of his exam is normal.  Rectal exam is significant for perirectal skin tags consistent with previous hemorrhoids.  However there is no obvious active source of bleeding.  Given the recurrent nature over the last 6 months, 47, I believe it, I believe it would be prudent to go ahead and see GI for colonoscopy just to rule out other potential causes of bright red blood per rectum.  Patient is willing to go ahead and submit with GI.  I will defer prostate cancer screening until age 58.  Meanwhile check CBC, CMP, fasting lipid panel.  Patient declines HIV screening due to lack of risk factors.  He received his tetanus shot today.  His blood pressure is outstanding.  I will make no changes in his blood pressure medication.  Continue to work on therapeutic  lifestyle changes to achieve weight loss

## 2017-11-19 ENCOUNTER — Encounter: Payer: Self-pay | Admitting: Family Medicine

## 2017-12-09 ENCOUNTER — Other Ambulatory Visit: Payer: Self-pay | Admitting: Family Medicine

## 2018-01-13 ENCOUNTER — Other Ambulatory Visit: Payer: Self-pay | Admitting: Family Medicine

## 2018-01-19 ENCOUNTER — Encounter: Payer: Self-pay | Admitting: Gastroenterology

## 2018-01-19 ENCOUNTER — Ambulatory Visit: Payer: BC Managed Care – PPO | Admitting: Gastroenterology

## 2018-01-19 ENCOUNTER — Encounter (INDEPENDENT_AMBULATORY_CARE_PROVIDER_SITE_OTHER): Payer: Self-pay

## 2018-01-19 VITALS — BP 142/94 | HR 72 | Ht 66.5 in | Wt 260.5 lb

## 2018-01-19 DIAGNOSIS — K625 Hemorrhage of anus and rectum: Secondary | ICD-10-CM

## 2018-01-19 MED ORDER — PEG-KCL-NACL-NASULF-NA ASC-C 140 G PO SOLR: 140.0000 g | ORAL | 0 refills | Status: DC

## 2018-01-19 NOTE — Progress Notes (Signed)
[facility] Consult Note:  History: Russell Novak 01/19/2018  Referring physician: Susy Frizzle, MD  Reason for consult/chief complaint: Rectal Bleeding (intermittent BRB on the toilet paper and in the toilet 1 month ago x 1 month)   Subjective  HPI:  This is a very pleasant 47 year old man referred by primary care for recent painless rectal bleeding.  It occurred several times a week a few months ago, however he has not had any for about the last 6 weeks.  He denies chronic abdominal pain and has had occasional tendencies to constipation.  Jacere has years of intermittent reflux for which she has been well controlled on once daily PPI over the last few years.  He denies dysphagia, odynophagia, nausea, vomiting, early satiety or weight loss.  He is aware of the need for diet and lifestyle measures including weight loss and is making best efforts at this.   ROS:  Review of Systems  He denies chest pain dyspnea or dysuria  Past Medical History: Past Medical History:  Diagnosis Date  . Hypertension   . Seizures ([place])      Past Surgical History: Past Surgical History:  Procedure Laterality Date  . TONSILLECTOMY       Family History: Family History  Problem Relation Age of Onset  . Lung cancer Mother   . Other Father        neck tumor  . Esophageal cancer Paternal Uncle   . Breast cancer Paternal Aunt   . Prostate cancer Paternal Uncle   . Diabetes Paternal Uncle   . Stroke Paternal Uncle     Social History: Social History   Socioeconomic History  . Marital status: Married    Spouse name: Not on file  . Number of children: 2  . Years of education: Not on file  . Highest education level: Not on file  Occupational History  . Occupation: Financial planner  Social Needs  . Financial resource strain: Not on file  . Food insecurity:    Worry: Not on file    Inability: Not on file  . Transportation needs:    Medical: Not on  file    Non-medical: Not on file  Tobacco Use  . Smoking status: Former Smoker    Types: Cigarettes    Last attempt to quit: 2007    Years since quitting: 12.6  . Smokeless tobacco: Never Used  . Tobacco comment: quit 2005  Substance and Sexual Activity  . Alcohol use: Yes    Comment: occasional  . Drug use: No  . Sexual activity: Not on file  Lifestyle  . Physical activity:    Days per week: Not on file    Minutes per session: Not on file  . Stress: Not on file  Relationships  . Social connections:    Talks on phone: Not on file    Gets together: Not on file    Attends religious service: Not on file    Active member of club or organization: Not on file    Attends meetings of clubs or organizations: Not on file    Relationship status: Not on file  Other Topics Concern  . Not on file  Social History Narrative  . Not on file   He owns an autobody shop  Allergies: No Known Allergies  Outpatient Meds: Current Outpatient Medications  Medication Sig Dispense Refill  . hydrochlorothiazide (HYDRODIURIL) 12.5 MG tablet TAKE 1 TABLET BY MOUTH EVERY DAY 30 tablet 0  . losartan (  COZAAR) 50 MG tablet TAKE 1 TABLET BY MOUTH EVERY DAY 30 tablet 0  . ranitidine (ZANTAC) 150 MG tablet Take 150 mg by mouth 2 (two) times daily.    Marland Kitchen PEG-KCl-NaCl-NaSulf-Na Asc-C (PLENVU) 140 g SOLR Take 140 g by mouth as directed. 1 each 0   No current facility-administered medications for this visit.       ___________________________________________________________________ Objective   Exam:  BP (!) 142/94 (BP Location: Left Arm, Patient Position: Sitting, Cuff Size: Normal)   Pulse 72   Ht 5' 6.5" (1.689 m) Comment: height measured without shoes  Wt 260 lb 8 oz (118.2 kg)   BMI 41.42 kg/m    General: this is a(n) well-appearing man  Eyes: sclera anicteric, no redness  ENT: oral mucosa moist without lesions, no cervical or supraclavicular lymphadenopathy, good dentition  CV: RRR  without murmur, S1/S2, no JVD, no peripheral edema  Resp: clear to auscultation bilaterally, normal RR and effort noted  GI: soft, no tenderness, with active bowel sounds. No guarding or palpable organomegaly noted.  Skin; warm and dry, no rash or jaundice noted  Neuro: awake, alert and oriented x 3. Normal gross motor function and fluent speech  Labs:  CBC Latest Ref Rng & Units 11/18/2017  WBC 3.8 - 10.8 Thousand/uL 7.4  Hemoglobin 13.2 - 17.1 g/dL 15.9  Hematocrit 38.5 - 50.0 % 46.3  Platelets 140 - 400 Thousand/uL 270      Assessment: Encounter Diagnosis  Name Primary?  . Rectal bleeding Yes    Probable benign cause such as internal hemorrhoids, but I advised him to have a colonoscopy to rule out polyps or cancer.  He is agreeable after thorough discussion of procedure and risks.  The benefits and risks of the planned procedure were described in detail with the patient or (when appropriate) their health care proxy.  Risks were outlined as including, but not limited to, bleeding, infection, perforation, adverse medication reaction leading to cardiac or pulmonary decompensation, or pancreatitis (if ERCP).  The limitation of incomplete mucosal visualization was also discussed.  No guarantees or warranties were given.   Thank you for the courtesy of this consult.  Please call me with any questions or concerns.  Nelida Meuse III  CC: Susy Frizzle, MD

## 2018-01-19 NOTE — Patient Instructions (Signed)
If you are age 49 or older, your body mass index should be between 23-30. Your Body mass index is 41.42 kg/m. If this is out of the aforementioned range listed, please consider follow up with your Primary Care Provider.  If you are age 68 or younger, your body mass index should be between 19-25. Your Body mass index is 41.42 kg/m. If this is out of the aformentioned range listed, please consider follow up with your Primary Care Provider.   You have been scheduled for a colonoscopy. Please follow written instructions given to you at your visit today.  Please pick up your prep supplies at the pharmacy within the next 1-3 days. If you use inhalers (even only as needed), please bring them with you on the day of your procedure. Your physician has requested that you go to www.startemmi.com and enter the access code given to you at your visit today. This web site gives a general overview about your procedure. However, you should still follow specific instructions given to you by our office regarding your preparation for the procedure.  It was a pleasure to see you today!  Dr. Loletha Carrow

## 2018-02-11 ENCOUNTER — Other Ambulatory Visit: Payer: Self-pay | Admitting: Family Medicine

## 2018-02-11 MED ORDER — HYDROCHLOROTHIAZIDE 12.5 MG PO TABS: 12.5000 mg | ORAL_TABLET | Freq: Every day | ORAL | 1 refills | Status: DC

## 2018-02-11 MED ORDER — LOSARTAN POTASSIUM 50 MG PO TABS: 50.0000 mg | ORAL_TABLET | Freq: Every day | ORAL | 1 refills | Status: DC

## 2018-02-11 NOTE — Addendum Note (Signed)
Addended by: Shary Decamp B on: 02/11/2018 09:55 AM   Modules accepted: Orders

## 2018-02-14 ENCOUNTER — Other Ambulatory Visit: Payer: Self-pay | Admitting: *Deleted

## 2018-02-14 MED ORDER — TELMISARTAN 40 MG PO TABS: ORAL_TABLET | ORAL | 0 refills | Status: DC

## 2018-02-14 MED ORDER — HYDROCHLOROTHIAZIDE 12.5 MG PO TABS: 12.5000 mg | ORAL_TABLET | Freq: Every day | ORAL | 1 refills | Status: DC

## 2018-02-16 ENCOUNTER — Telehealth: Payer: Self-pay | Admitting: Family Medicine

## 2018-02-16 MED ORDER — LISINOPRIL 20 MG PO TABS: 20.0000 mg | ORAL_TABLET | Freq: Every day | ORAL | 3 refills | Status: DC

## 2018-02-16 NOTE — Telephone Encounter (Signed)
Pharmacy sent note that Micardis is on backorder and can we change. Per Dr. Dennard Schaumann switch to Lisinopril 20mg  qd. Pharmacy made aware and new rx sent.

## 2018-02-28 ENCOUNTER — Encounter: Payer: Self-pay | Admitting: Gastroenterology

## 2018-03-14 ENCOUNTER — Telehealth: Payer: Self-pay | Admitting: Gastroenterology

## 2018-03-14 ENCOUNTER — Ambulatory Visit (AMBULATORY_SURGERY_CENTER): Payer: BC Managed Care – PPO | Admitting: Gastroenterology

## 2018-03-14 ENCOUNTER — Encounter: Payer: Self-pay | Admitting: Gastroenterology

## 2018-03-14 VITALS — BP 110/61 | HR 60 | Temp 98.4°F | Resp 20 | Ht 66.0 in | Wt 260.0 lb

## 2018-03-14 DIAGNOSIS — D124 Benign neoplasm of descending colon: Secondary | ICD-10-CM | POA: Diagnosis not present

## 2018-03-14 DIAGNOSIS — D123 Benign neoplasm of transverse colon: Secondary | ICD-10-CM | POA: Diagnosis not present

## 2018-03-14 DIAGNOSIS — D122 Benign neoplasm of ascending colon: Secondary | ICD-10-CM

## 2018-03-14 DIAGNOSIS — K625 Hemorrhage of anus and rectum: Secondary | ICD-10-CM

## 2018-03-14 DIAGNOSIS — C186 Malignant neoplasm of descending colon: Secondary | ICD-10-CM | POA: Diagnosis not present

## 2018-03-14 MED ORDER — SODIUM CHLORIDE 0.9 % IV SOLN: 500.0000 mL | Freq: Once | INTRAVENOUS | Status: DC

## 2018-03-14 NOTE — Telephone Encounter (Signed)
Please send a referral to colo-rectal surgery (Dr. Nadeen Landau or Dr. Leighton Ruff) at [facility] for internal hemorrhoid, abnormal appearance, please evaluate.

## 2018-03-14 NOTE — Patient Instructions (Signed)
Handout:  Polyps  YOU HAD AN ENDOSCOPIC PROCEDURE TODAY AT THE [facility]:   Refer to the procedure report that was given to you for any specific questions about what was found during the examination.  If the procedure report does not answer your questions, please call your gastroenterologist to clarify.  If you requested that your care partner not be given the details of your procedure findings, then the procedure report has been included in a sealed envelope for you to review at your convenience later.  YOU SHOULD EXPECT: Some feelings of bloating in the abdomen. Passage of more gas than usual.  Walking can help get rid of the air that was put into your GI tract during the procedure and reduce the bloating. If you had a lower endoscopy (such as a colonoscopy or flexible sigmoidoscopy) you may notice spotting of blood in your stool or on the toilet paper. If you underwent a bowel prep for your procedure, you may not have a normal bowel movement for a few days.  Please Note:  You might notice some irritation and congestion in your nose or some drainage.  This is from the oxygen used during your procedure.  There is no need for concern and it should clear up in a day or so.  SYMPTOMS TO REPORT IMMEDIATELY:   Following lower endoscopy (colonoscopy or flexible sigmoidoscopy):  Excessive amounts of blood in the stool  Significant tenderness or worsening of abdominal pains  Swelling of the abdomen that is new, acute  Fever of 100F or higher  For urgent or emergent issues, a gastroenterologist can be reached at any hour by calling [phone].   DIET:  We do recommend a small meal at first, but then you may proceed to your regular diet.  Drink plenty of fluids but you should avoid alcoholic beverages for 24 hours.  ACTIVITY:  You should plan to take it easy for the rest of today and you should NOT DRIVE or use heavy machinery until tomorrow (because of the sedation medicines used  during the test).    FOLLOW UP: Our staff will call the number listed on your records the next business day following your procedure to check on you and address any questions or concerns that you may have regarding the information given to you following your procedure. If we do not reach you, we will leave a message.  However, if you are feeling well and you are not experiencing any problems, there is no need to return our call.  We will assume that you have returned to your regular daily activities without incident.  If any biopsies were taken you will be contacted by phone or by letter within the next 1-3 weeks.  Please call us at [phone] if you have not heard about the biopsies in 3 weeks.    SIGNATURES/CONFIDENTIALITY: You and/or your care partner have signed paperwork which will be entered into your electronic medical record.  These signatures attest to the fact that that the information above on your After Visit Summary has been reviewed and is understood.  Full responsibility of the confidentiality of this discharge information lies with you and/or your care-partner. 

## 2018-03-14 NOTE — Progress Notes (Signed)
To PACU, vss patent aw report to rn 

## 2018-03-14 NOTE — Progress Notes (Signed)
Called to room to assist during endoscopic procedure.  Patient ID and intended procedure confirmed with present staff. Received instructions for my participation in the procedure from the performing physician.  

## 2018-03-14 NOTE — Op Note (Signed)
[facility] Patient Name: Russell Novak Procedure Date: 03/14/2018 1:21 PM MRN: [id number] Endoscopist: [place]. Loletha Carrow , MD Age: 47 Referring MD:  Date of Birth: 1970/06/25 Gender: Male Account #: [id number] Procedure:                Colonoscopy Indications:              Rectal bleeding Medicines:                Monitored Anesthesia Care Procedure:                Pre-Anesthesia Assessment:                           - Prior to the procedure, a History and Physical                            was performed, and patient medications and                            allergies were reviewed. The patient's tolerance of                            previous anesthesia was also reviewed. The risks                            and benefits of the procedure and the sedation                            options and risks were discussed with the patient.                            All questions were answered, and informed consent                            was obtained. Prior Anticoagulants: The patient has                            taken no previous anticoagulant or antiplatelet                            agents. ASA Grade Assessment: III - A patient with                            severe systemic disease. After reviewing the risks                            and benefits, the patient was deemed in                            satisfactory condition to undergo the procedure.                           After obtaining informed consent, the colonoscope  was passed under direct vision. Throughout the                            procedure, the patient's blood pressure, pulse, and                            oxygen saturations were monitored continuously. The                            Model CF-HQ190L [id number]) scope was introduced                            through the anus and advanced to the the cecum,                            identified by appendiceal orifice and  ileocecal                            valve. The patient tolerated the procedure well.                            The colonoscopy was somewhat difficult due to                            significant looping. Successful completion of the                            procedure was aided by changing the patient to a                            supine position and using manual pressure. The                            quality of the bowel preparation was excellent. The                            ileocecal valve, appendiceal orifice, and rectum                            were photographed. Scope In: 1:25:58 PM Scope Out: 2:26:53 PM Scope Withdrawal Time: 0 hours 48 minutes 39 seconds  Total Procedure Duration: 1 hour 0 minutes 55 seconds  Findings:                 The perianal and digital rectal examinations were                            normal.                           A 8 mm polyp was found in the distal descending                            colon. The polyp was sessile. The polyp was removed  with a hot snare. Resection and retrieval were                            complete. (Jar 1)                           Three sessile polyps were found in the proximal                            descending colon, splenic flexure and ascending                            colon. The polyps were 3 to 5 mm in size. These                            polyps were removed with a cold snare. Resection                            and retrieval were complete. (Jar 2)                           A 8 mm polyp was found in the distal transverse                            colon. The polyp was sessile. The polyp was removed                            with a hot snare. Resection and retrieval were                            complete.(Jar 3)                           A 12 mm polyp was found in the splenic flexure. The                            polyp was pedunculated. The polyp was removed with                             a hot snare. Resection and retrieval were complete.                            Area was successfully injected with 0.5 mL Spot                            (carbon black) for tattooing. (Jar 4)                           A 12 mm polyp was found in the descending colon.                            The polyp was pedunculated. The polyp was removed  with a hot snare. Resection and retrieval were                            complete. To prevent bleeding post-intervention,                            one hemostatic clip was successfully placed (MR                            conditional). Area was successfully injected with                            0.5 mL Spot (carbon black) for tattooing. (Jar 5)                           A 15 mm polyp was found in the distal descending                            colon. The polyp was pedunculated. The polyp was                            removed with a hot snare. Resection and retrieval                            were complete. To prevent bleeding after the                            polypectomy, two hemostatic clips were successfully                            placed (MR conditional). Area was successfully                            injected with 0.5 mL Spot (carbon black) for                            tattooing.                           Internal hemorrhoids were found. The hemorrhoids                            were large and inflamed, in the right anterior                            position. Complications:            No immediate complications. Estimated Blood Loss:     Estimated blood loss was minimal. Impression:               - One 8 mm polyp in the distal descending colon,                            removed with a hot snare. Resected and retrieved.                           -  Three 3 to 5 mm polyps in the proximal descending                            colon, at the splenic flexure and in the ascending                             colon, removed with a cold snare. Resected and                            retrieved.                           - One 8 mm polyp in the distal transverse colon,                            removed with a hot snare. Resected and retrieved.                           - One 12 mm polyp at the splenic flexure, removed                            with a hot snare. Resected and retrieved. Injected.                           - One 12 mm polyp in the descending colon, removed                            with a hot snare. Resected and retrieved. Clip (MR                            conditional) was placed. Injected.                           - One 15 mm polyp in the distal descending colon,                            removed with a hot snare. Resected and retrieved.                            Clips (MR conditional) were placed. Injected.                           - Internal hemorrhoids. Recommendation:           - Patient has a contact number available for                            emergencies. The signs and symptoms of potential                            delayed complications were discussed with the  patient. Return to normal activities tomorrow.                            Written discharge instructions were provided to the                            patient.                           - Resume previous diet.                           - No aspirin, ibuprofen, naproxen, or other                            non-steroidal anti-inflammatory drugs for 7 days                            after polyp removal.                           - Await pathology results.                           - Repeat colonoscopy is recommended for                            surveillance. The colonoscopy date will be                            determined after pathology results from today's                            exam become available for review.                           - Refer to a  colo-rectal surgeon at appointment to                            be scheduled to evaluate internal hemorrhoids. Wells Mabe L. Loletha Carrow, MD 03/14/2018 2:41:57 PM This report has been signed electronically.

## 2018-03-15 ENCOUNTER — Telehealth: Payer: Self-pay | Admitting: *Deleted

## 2018-03-15 NOTE — Telephone Encounter (Signed)
Referral sent to [facility] for internal hemorrhoids either Dr. Dema Severin or Dr. Marcello Moores.

## 2018-03-15 NOTE — Telephone Encounter (Signed)
  Follow up Call-  Call back number 03/14/2018  Post procedure Call Back phone  # [phone]  Permission to leave phone message Yes  Some recent data might be hidden     Patient questions:  Do you have a fever, pain , or abdominal swelling? No. Pain Score  0 *  Have you tolerated food without any problems? Yes.    Have you been able to return to your normal activities? Yes.    Do you have any questions about your discharge instructions: Diet   No. Medications  No. Follow up visit  No.  Do you have questions or concerns about your Care? No.  Actions: * If pain score is 4 or above: No action needed, pain <4.

## 2018-03-21 ENCOUNTER — Telehealth: Payer: Self-pay

## 2018-03-21 NOTE — Telephone Encounter (Signed)
Patient has [facility] appointment with Dr. Marcello Moores on 04/04/18 at 10:30.

## 2018-03-22 ENCOUNTER — Other Ambulatory Visit (INDEPENDENT_AMBULATORY_CARE_PROVIDER_SITE_OTHER): Payer: BC Managed Care – PPO

## 2018-03-22 ENCOUNTER — Encounter: Payer: Self-pay | Admitting: Gastroenterology

## 2018-03-22 ENCOUNTER — Ambulatory Visit (INDEPENDENT_AMBULATORY_CARE_PROVIDER_SITE_OTHER): Payer: BC Managed Care – PPO | Admitting: Gastroenterology

## 2018-03-22 ENCOUNTER — Other Ambulatory Visit: Payer: Self-pay

## 2018-03-22 ENCOUNTER — Telehealth: Payer: Self-pay | Admitting: Gastroenterology

## 2018-03-22 VITALS — BP 148/80 | HR 90 | Ht 66.5 in | Wt 260.0 lb

## 2018-03-22 DIAGNOSIS — C186 Malignant neoplasm of descending colon: Secondary | ICD-10-CM

## 2018-03-22 DIAGNOSIS — K625 Hemorrhage of anus and rectum: Secondary | ICD-10-CM

## 2018-03-22 LAB — CBC WITH DIFFERENTIAL/PLATELET
BASOS ABS: 0.1 10*3/uL (ref 0.0–0.1)
BASOS PCT: 0.6 % (ref 0.0–3.0)
EOS ABS: 0.4 10*3/uL (ref 0.0–0.7)
Eosinophils Relative: 3.9 % (ref 0.0–5.0)
HCT: 39.4 % (ref 39.0–52.0)
HEMOGLOBIN: 13.8 g/dL (ref 13.0–17.0)
Lymphocytes Relative: 30.5 % (ref 12.0–46.0)
Lymphs Abs: 3.2 10*3/uL (ref 0.7–4.0)
MCHC: 34.9 g/dL (ref 30.0–36.0)
MCV: 83.6 fl (ref 78.0–100.0)
Monocytes Absolute: 0.6 10*3/uL (ref 0.1–1.0)
Monocytes Relative: 5.8 % (ref 3.0–12.0)
Neutro Abs: 6.1 10*3/uL (ref 1.4–7.7)
Neutrophils Relative %: 59.2 % (ref 43.0–77.0)
Platelets: 306 10*3/uL (ref 150.0–400.0)
RBC: 4.72 Mil/uL (ref 4.22–5.81)
RDW: 13.6 % (ref 11.5–15.5)
WBC: 10.4 10*3/uL (ref 4.0–10.5)

## 2018-03-22 NOTE — Progress Notes (Signed)
[facility] Progress Note  Chief Complaint: Lower GI bleeding  Subjective  History:  I spoke to [place] earlier today about his colon polyp results, and particularly that the largest descending colon polyp had cancer.  Fortunately, there was a 5 mm margin of removal on the stalk.  He then told me he had been having bright red, maroon and black stool for over a week since his procedure.  It apparently got worse over the weekend when he was also feeling lightheaded and said his blood pressure was low for him, which was 110/60.  I recommended he come to the lab for stat CBC and wait for the results.  I then decided to see him as an urgent work in clinic visit. He says the black stool has subsided in the last 24 to 48 hours he denies abdominal pain, he has been eating well.  He is taken a couple of Aleve doses over the last week but no aspirin.  ROS: Cardiovascular:  no chest pain Respiratory: no dyspnea He does not feel lightheaded today  The patient's Past Medical, Family and Social History were reviewed and are on file in the EMR.  Objective:  Med list reviewed  Current Outpatient Medications:  .  hydrochlorothiazide (HYDRODIURIL) 12.5 MG tablet, Take 1 tablet (12.5 mg total) by mouth daily., Disp: 90 tablet, Rfl: 1 .  lisinopril (PRINIVIL,ZESTRIL) 20 MG tablet, Take 1 tablet (20 mg total) by mouth daily., Disp: 90 tablet, Rfl: 3 .  ranitidine (ZANTAC) 150 MG tablet, Take 150 mg by mouth 2 (two) times daily., Disp: , Rfl:    Vital signs in last 24 hrs: Vitals:   03/22/18 1507  BP: (!) 148/80  Pulse: 90    Physical Exam  He is well-appearing, alert and conversational  HEENT: sclera anicteric, oral mucosa moist without lesions  Cardiac: RRR without murmurs, S1S2 heard, no peripheral edema  Pulm: clear to auscultation bilaterally, normal RR and effort noted  Abdomen: soft, no tenderness, with active bowel sounds. No guarding or palpable hepatosplenomegaly.  Skin;  warm and dry, no jaundice or rash Rectal: Light brown, heme positive stool.  Recent Labs:  CBC Latest Ref Rng & Units 03/22/2018 11/18/2017  WBC 4.0 - 10.5 K/uL 10.4 7.4  Hemoglobin 13.0 - 17.0 g/dL 13.8 15.9  Hematocrit 39.0 - 52.0 % 39.4 46.3  Platelets 150.0 - 400.0 K/uL 306.0 270    @ASSESSMENTPLANBEGIN @ Assessment: Encounter Diagnoses  Name Primary?  . 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 Rectal bleeding     He appears to have had post polypectomy bleeding for the better part of last week.  However, hemoglobin remains normal.  The largest descending colon polyp the probable source of bleeding since it had the thickest stalk, even though clip was applied prior to polypectomy. I am not sure that the blood loss has caused his lightheadedness, and I recommended he check with primary care since he has a new blood pressure medicine started last month.   Regarding the malignancy, this was naturally a surprising finding.  I am still not sure whether any of these colon polyps, even the large pedunculated once, with a source of the couple episodes of painless bleeding he had had that prompted the procedure.  He did have a significant internal hemorrhoid that still may have been the cause of the original bleeding.  There is a 5 mm margin on polyp tissue.  Therefore, I think he is not likely to need surgery.  However, I will ask that his case be put on for review at the weekly cancer conference for consensus.  I have also put him in for a six-month colonoscopy recall.   Nelida Meuse III

## 2018-03-22 NOTE — Telephone Encounter (Signed)
Russell Novak,  I called Russell Novak just now to discuss his polyp pathology, including the distal descending colon polyp that has cancer within it.  He will have a recall colonoscopy in 6 months, and I sent that note to the [facility] nurse.  I am also planning to present his case at weekly cancer conference to make sure there is consensus that there is sufficient resection margin and no further therapy such as surgery is required.  However, almost in passing at the end of our talk, he mentioned that he has been having bleeding several times a day ever since the colonoscopy last Monday.  It was initially a small volume of bright red blood, then much more of that over the weekend, and then yesterday subsided to black tarry stool.  He had recently been put on a new blood pressure medicine, but stopped it because he noted his blood pressure was low in the last couple of days.  He had not called anyone about this bleeding, hoping it would just get better.  I told him I doubt that it hemorrhoidal bleeding, and is most likely polypectomy site bleeding even though clips were placed on stalks.  I advised him to have his wife drive him to the lab for a STAT CBC at the lab in our office, then come up to our waiting room while we await the result. Please place the order, as he plans to come within the next hour.

## 2018-03-23 ENCOUNTER — Encounter: Payer: Self-pay | Admitting: Family Medicine

## 2018-03-23 DIAGNOSIS — C189 Malignant neoplasm of colon, unspecified: Secondary | ICD-10-CM | POA: Insufficient documentation

## 2018-03-30 ENCOUNTER — Other Ambulatory Visit: Payer: Self-pay

## 2018-03-30 NOTE — Progress Notes (Signed)
Recommended  CT CAP and CEA level. Referrals to [place] surgeon and Genetics.

## 2018-03-31 ENCOUNTER — Other Ambulatory Visit: Payer: Self-pay

## 2018-03-31 ENCOUNTER — Telehealth: Payer: Self-pay | Admitting: Gastroenterology

## 2018-03-31 DIAGNOSIS — C186 Malignant neoplasm of descending colon: Secondary | ICD-10-CM

## 2018-03-31 NOTE — Telephone Encounter (Signed)
Patient is scheduled for CT tomorrow at [facility], had to order as STAT. He will come pick up prep tomorrow morning and get his labwork done.

## 2018-03-31 NOTE — Telephone Encounter (Signed)
Russell Novak,  I spoke with Russell Novak about the consensus from cancer conference yesterday.  Surgical consultant would like full staging work-up with CEA lab, CT scan chest and abdomen and pelvis with oral and IV contrast.  Please contact the patient to make those arrangements.  Please also communicate with [facility] today to see if Dr. Marcello Moores would like to move the office appointment to a later date, unless somehow the CT scans can get done by tomorrow.  Please also send a referral to genetic counseling for colon cancer.

## 2018-04-01 ENCOUNTER — Other Ambulatory Visit: Payer: BC Managed Care – PPO

## 2018-04-01 ENCOUNTER — Encounter: Payer: Self-pay | Admitting: Genetic Counselor

## 2018-04-01 ENCOUNTER — Ambulatory Visit (HOSPITAL_COMMUNITY)
Admission: RE | Admit: 2018-04-01 | Discharge: 2018-04-01 | Disposition: A | Discharge status: Home / Self Care | Payer: BC Managed Care – PPO | Source: Ambulatory Visit | Attending: Gastroenterology | Admitting: Gastroenterology

## 2018-04-01 ENCOUNTER — Telehealth: Payer: Self-pay | Admitting: Genetic Counselor

## 2018-04-01 DIAGNOSIS — D3501 Benign neoplasm of right adrenal gland: Secondary | ICD-10-CM | POA: Insufficient documentation

## 2018-04-01 DIAGNOSIS — K760 Fatty (change of) liver, not elsewhere classified: Secondary | ICD-10-CM | POA: Insufficient documentation

## 2018-04-01 DIAGNOSIS — I2510 Atherosclerotic heart disease of native coronary artery without angina pectoris: Secondary | ICD-10-CM | POA: Diagnosis not present

## 2018-04-01 DIAGNOSIS — M47816 Spondylosis without myelopathy or radiculopathy, lumbar region: Secondary | ICD-10-CM | POA: Insufficient documentation

## 2018-04-01 DIAGNOSIS — C186 Malignant neoplasm of descending colon: Secondary | ICD-10-CM

## 2018-04-01 DIAGNOSIS — N289 Disorder of kidney and ureter, unspecified: Secondary | ICD-10-CM | POA: Diagnosis not present

## 2018-04-01 DIAGNOSIS — M5136 Other intervertebral disc degeneration, lumbar region: Secondary | ICD-10-CM | POA: Insufficient documentation

## 2018-04-01 DIAGNOSIS — I700 Atherosclerosis of aorta: Secondary | ICD-10-CM | POA: Diagnosis not present

## 2018-04-01 MED ORDER — SODIUM CHLORIDE 0.9 % IJ SOLN
INTRAMUSCULAR | Status: AC
  Filled 2018-04-01: qty 50

## 2018-04-01 MED ORDER — IOHEXOL 300 MG/ML  SOLN
100.0000 mL | Freq: Once | INTRAMUSCULAR | Status: AC | PRN
  Administered 2018-04-01: 100 mL via INTRAVENOUS

## 2018-04-01 NOTE — Telephone Encounter (Signed)
Received a genetic counseling referral from Dr. Loletha Carrow for colon cancer. Pt has been cld and scheduled to see Roma Kayser on 11/25 at 9am. Pt aware to arrive 15 minutes early. Letter mailed.

## 2018-04-04 ENCOUNTER — Ambulatory Visit: Payer: Self-pay | Admitting: General Surgery

## 2018-04-04 LAB — CEA: CEA: 0.6 ng/mL

## 2018-04-04 NOTE — H&P (Signed)
History of Present Illness Russell Ruff MD; 38/11/5641 11:17 AM) The patient is a 47 year old male who presents with colorectal cancer. 47 year old male who presents to the office after colonoscopy was performed for rectal bleeding. He was noted to have a hemorrhoid as the source of his rectal bleeding but several polyps were also removed. The most distal polyp in the descending colon was significant for a poorly invasive adenocarcinoma. Margins were negative. CT scans of the chest abdomen and pelvis have been performed and showed no signs of metastatic disease. He has not had a CEA level. He is here to discuss surgery.   Past Surgical History Mammie Lorenzo, LPN; 32/01/5187 41:66 AM) Colon Polyp Removal - Colonoscopy  Diagnostic Studies History Mammie Lorenzo, LPN; 11/02/158 10:93 AM) Colonoscopy within last year  Allergies Mammie Lorenzo, LPN; 23/09/5730 20:25 AM) No Known Drug Allergies [04/04/2018]:  Medication History Mammie Lorenzo, LPN; 42/11/621 76:28 AM) Telmisartan (40MG  Tablet, Oral) Active. Medications Reconciled  Social History Mammie Lorenzo, LPN; 31/09/1759 60:73 AM) Alcohol use Occasional alcohol use. Caffeine use Tea. No drug use Tobacco use Former smoker.  Family History Mammie Lorenzo, LPN; 71/0/6269 48:54 AM) Colon Polyps Father. Migraine Headache Son.  Other Problems Mammie Lorenzo, LPN; 62/11/348 09:38 AM) High blood pressure     Review of Systems Claiborne Billings Dockery LPN; 18/07/9935 16:96 AM) General Not Present- Appetite Loss, Chills, Fatigue, Fever, Night Sweats, Weight Gain and Weight Loss. HEENT Present- Ringing in the Ears and Wears glasses/contact lenses. Not Present- Earache, Hearing Loss, Hoarseness, Nose Bleed, Oral Ulcers, Seasonal Allergies, Sinus Pain, Sore Throat, Visual Disturbances and Yellow Eyes. Respiratory Present- Snoring. Not Present- Bloody sputum, Chronic Cough, Difficulty Breathing and Wheezing. Breast Not Present-  Breast Mass, Breast Pain, Nipple Discharge and Skin Changes. Gastrointestinal Present- Bloody Stool and Indigestion. Not Present- Abdominal Pain, Bloating, Change in Bowel Habits, Chronic diarrhea, Constipation, Difficulty Swallowing, Excessive gas, Gets full quickly at meals, Hemorrhoids, Nausea, Rectal Pain and Vomiting. Neurological Not Present- Decreased Memory, Fainting, Headaches, Numbness, Seizures, Tingling, Tremor, Trouble walking and Weakness.  Vitals Claiborne Billings Dockery LPN; 78/01/3809 17:51 AM) 04/04/2018 10:56 AM Weight: 255.6 lb Height: 68in Body Surface Area: 2.27 m Body Mass Index: 38.86 kg/m  Temp.: 97.49F(Temporal)  Pulse: 68 (Regular)  BP: 142/84 (Sitting, Right Arm, Standard)      Physical Exam Russell Ruff MD; 07/06/8525 11:18 AM)  General Mental Status-Alert. General Appearance-Not in acute distress. Build & Nutrition-Well nourished. Posture-Normal posture. Gait-Normal.  Head and Neck Head-normocephalic, atraumatic with no lesions or palpable masses. Trachea-midline.  Chest and Lung Exam Chest and lung exam reveals -on auscultation, normal breath sounds, no adventitious sounds and normal vocal resonance.  Cardiovascular Cardiovascular examination reveals -normal heart sounds, regular rate and rhythm with no murmurs and no digital clubbing, cyanosis, edema, increased warmth or tenderness.  Abdomen Inspection Inspection of the abdomen reveals - No Hernias. Palpation/Percussion Palpation and Percussion of the abdomen reveal - Soft, Non Tender, No Rigidity (guarding), No hepatosplenomegaly and No Palpable abdominal masses.  Neurologic Neurologic evaluation reveals -alert and oriented x 3 with no impairment of recent or remote memory, normal attention span and ability to concentrate, normal sensation and normal coordination.  Musculoskeletal Normal Exam - Bilateral-Upper Extremity Strength Normal and Lower Extremity Strength  Normal.    Assessment & Plan Russell Ruff MD; 78/07/4233 11:19 AM)  MALIGNANT NEOPLASM OF DESCENDING COLON (C18.6) Impression: 47 year old male with an invasive colon cancer which pathology shows a poorly differentiated. This is a high-risk polyp and surgery is recommended.  The area is tattooed as the most distal polyp. This most likely will be a sigmoidectomy. We discussed the risk of a high-risk polyp having lymph node metastases to be approximately 20%. The surgery and anatomy were described to the patient as well as the risks of surgery and the possible complications. These include: Bleeding, deep abdominal infections and possible wound complications such as hernia and infection, damage to adjacent structures, leak of surgical connections, which can lead to other surgeries and possibly an ostomy, possible need for other procedures, such as abscess drains in radiology, possible prolonged hospital stay, possible diarrhea from removal of part of the colon, possible constipation from narcotics, possible bowel, bladder or sexual dysfunction if having rectal surgery, prolonged fatigue/weakness or appetite loss, possible early recurrence of of disease, possible complications of their medical problems such as heart disease or arrhythmias or lung problems, death (less than 1%). I believe the patient understands and wishes to proceed with the surgery.

## 2018-04-07 ENCOUNTER — Encounter: Payer: Self-pay | Admitting: Family Medicine

## 2018-04-08 ENCOUNTER — Other Ambulatory Visit: Payer: Self-pay | Admitting: Family Medicine

## 2018-04-25 ENCOUNTER — Encounter: Payer: BC Managed Care – PPO | Admitting: Genetic Counselor

## 2018-04-25 ENCOUNTER — Other Ambulatory Visit: Payer: BC Managed Care – PPO

## 2018-05-17 NOTE — Patient Instructions (Signed)
Delsin [facility]  05/17/2018   Your procedure is scheduled on: 05-20-18  Report to [place]  Entrance  Report to admitting at       0530 AM    Call this number if you have problems the morning of surgery [phone]                FOLLOW BOWEL PREP PER MD AND FOLLOW A CLEAR LIQUID DIET THE DAY OF YOUR BOWEL PREP TO PREVENT DEHYDRATION   Remember:DRINK 2 PRESURGERY ENSURE DRINKS THE NIGHT BEFORE SURGERY AT  1000 PM AND 1 PRESURGERY DRINK THE DAY OF THE PROCEDURE 3 HOURS PRIOR TO SCHEDULED SURGERY. NO SOLIDS AFTER MIDNIGHT THE DAY PRIOR TO THE SURGERY. NOTHING BY MOUTH EXCEPT CLEAR LIQUIDS UNTIL THREE HOURS PRIOR TO SCHEDULED SURGERY. PLEASE FINISH PRESURGERY ENSURE DRINK PER SURGEON ORDER 3 HOURS PRIOR TO SCHEDULED SURGERY TIME WHICH NEEDS TO BE COMPLETED AT ___0430 am then nothing by mouth______.     CLEAR LIQUID DIET   Foods Allowed                                                                     Foods Excluded  Coffee and tea, regular and decaf                             liquids that you cannot  Plain Jell-O in any flavor                                             see through such as: Fruit ices (not with fruit pulp)                                     milk, soups, orange juice  Iced Popsicles                                    All solid food Carbonated beverages, regular and diet                                    Cranberry, grape and apple juices Sports drinks like Gatorade Lightly seasoned clear broth or consume(fat free) Sugar, honey syrup  Sample Menu Breakfast                                Lunch                                     Supper Cranberry juice                    Beef broth  Chicken broth Jell-O                                     Grape juice                           Apple juice Coffee or tea                        Jell-O                                      Popsicle                                                 Coffee or tea                        Coffee or tea  _____________________________________________________________________     BRUSH YOUR TEETH MORNING OF SURGERY AND RINSE YOUR MOUTH OUT, NO CHEWING GUM CANDY OR MINTS.     Take these medicines the morning of surgery with A SIP OF WATER: zantac                                You may not have any metal on your body including hair pins and              piercings  Do not wear jewelry,  lotions, powders or perfumes, deodorant                      Men may shave face and neck.   Do not bring valuables to the hospital. [place].  Contacts, dentures or bridgework may not be worn into surgery.  Leave suitcase in the car. After surgery it may be brought to your room.                Please read over the following fact sheets you were given: _____________________________________________________________________          [facility] - Preparing for Surgery Before surgery, you can play an important role.  Because skin is not sterile, your skin needs to be as free of germs as possible.  You can reduce the number of germs on your skin by washing with CHG (chlorahexidine gluconate) soap before surgery.  CHG is an antiseptic cleaner which kills germs and bonds with the skin to continue killing germs even after washing. Please DO NOT use if you have an allergy to CHG or antibacterial soaps.  If your skin becomes reddened/irritated stop using the CHG and inform your nurse when you arrive at Short Stay. Do not shave (including legs and underarms) for at least 48 hours prior to the first CHG shower.  You may shave your face/neck. Please follow these instructions carefully:  1.  Shower with CHG Soap the night before surgery and the  morning of Surgery.  2.  If you choose to wash your hair,  wash your hair first as usual with your  normal  shampoo.  3.  After you shampoo, rinse your hair  and body thoroughly to remove the  shampoo.                           4.  Use CHG as you would any other liquid soap.  You can apply chg directly  to the skin and wash                       Gently with a scrungie or clean washcloth.  5.  Apply the CHG Soap to your body ONLY FROM THE NECK DOWN.   Do not use on face/ open                           Wound or open sores. Avoid contact with eyes, ears mouth and genitals (private parts).                       Wash face,  Genitals (private parts) with your normal soap.             6.  Wash thoroughly, paying special attention to the area where your surgery  will be performed.  7.  Thoroughly rinse your body with warm water from the neck down.  8.  DO NOT shower/wash with your normal soap after using and rinsing off  the CHG Soap.                9.  Pat yourself dry with a clean towel.            10.  Wear clean pajamas.            11.  Place clean sheets on your bed the night of your first shower and do not  sleep with pets. Day of Surgery : Do not apply any lotions/deodorants the morning of surgery.  Please wear clean clothes to the hospital/surgery center.  FAILURE TO FOLLOW THESE INSTRUCTIONS MAY RESULT IN THE CANCELLATION OF YOUR SURGERY PATIENT SIGNATURE_________________________________  NURSE SIGNATURE__________________________________  ________________________________________________________________________  WHAT IS A BLOOD TRANSFUSION? Blood Transfusion Information  A transfusion is the replacement of blood or some of its parts. Blood is made up of multiple cells which provide different functions.  Red blood cells carry oxygen and are used for blood loss replacement.  White blood cells fight against infection.  Platelets control bleeding.  Plasma helps clot blood.  Other blood products are available for specialized needs, such as hemophilia or other clotting disorders. BEFORE THE TRANSFUSION  Who gives blood for transfusions?    Healthy volunteers who are fully evaluated to make sure their blood is safe. This is blood bank blood. Transfusion therapy is the safest it has ever been in the practice of medicine. Before blood is taken from a donor, a complete history is taken to make sure that person has no history of diseases nor engages in risky social behavior (examples are intravenous drug use or sexual activity with multiple partners). The donor's travel history is screened to minimize risk of transmitting infections, such as malaria. The donated blood is tested for signs of infectious diseases, such as HIV and hepatitis. The blood is then tested to be sure it is compatible with you in order to minimize the chance of a transfusion reaction. If you or a relative donates  blood, this is often done in anticipation of surgery and is not appropriate for emergency situations. It takes many days to process the donated blood. RISKS AND COMPLICATIONS Although transfusion therapy is very safe and saves many lives, the main dangers of transfusion include:   Getting an infectious disease.  Developing a transfusion reaction. This is an allergic reaction to something in the blood you were given. Every precaution is taken to prevent this. The decision to have a blood transfusion has been considered carefully by your caregiver before blood is given. Blood is not given unless the benefits outweigh the risks. AFTER THE TRANSFUSION  Right after receiving a blood transfusion, you will usually feel much better and more energetic. This is especially true if your red blood cells have gotten low (anemic). The transfusion raises the level of the red blood cells which carry oxygen, and this usually causes an energy increase.  The nurse administering the transfusion will monitor you carefully for complications. HOME CARE INSTRUCTIONS  No special instructions are needed after a transfusion. You may find your energy is better. Speak with your  caregiver about any limitations on activity for underlying diseases you may have. SEEK MEDICAL CARE IF:   Your condition is not improving after your transfusion.  You develop redness or irritation at the intravenous (IV) site. SEEK IMMEDIATE MEDICAL CARE IF:  Any of the following symptoms occur over the next 12 hours:  Shaking chills.  You have a temperature by mouth above 102 F (38.9 C), not controlled by medicine.  Chest, back, or muscle pain.  People around you feel you are not acting correctly or are confused.  Shortness of breath or difficulty breathing.  Dizziness and fainting.  You get a rash or develop hives.  You have a decrease in urine output.  Your urine turns a dark color or changes to pink, red, or brown. Any of the following symptoms occur over the next 10 days:  You have a temperature by mouth above 102 F (38.9 C), not controlled by medicine.  Shortness of breath.  Weakness after normal activity.  The white part of the eye turns yellow (jaundice).  You have a decrease in the amount of urine or are urinating less often.  Your urine turns a dark color or changes to pink, red, or brown. Document Released: 05/15/2000 Document Revised: 08/10/2011 Document Reviewed: 01/02/2008 ExitCare Patient Information 2014 [place].  _______________________________________________________________________  Incentive Spirometer  An incentive spirometer is a tool that can help keep your lungs clear and active. This tool measures how well you are filling your lungs with each breath. Taking long deep breaths may help reverse or decrease the chance of developing breathing (pulmonary) problems (especially infection) following:  A long period of time when you are unable to move or be active. BEFORE THE PROCEDURE   If the spirometer includes an indicator to show your best effort, your nurse or respiratory therapist will set it to a desired goal.  If possible, sit  up straight or lean slightly forward. Try not to slouch.  Hold the incentive spirometer in an upright position. INSTRUCTIONS FOR USE  1. Sit on the edge of your bed if possible, or sit up as far as you can in bed or on a chair. 2. Hold the incentive spirometer in an upright position. 3. Breathe out normally. 4. Place the mouthpiece in your mouth and seal your lips tightly around it. 5. Breathe in slowly and as deeply as possible, raising the piston  or the ball toward the top of the column. 6. Hold your breath for 3-5 seconds or for as long as possible. Allow the piston or ball to fall to the bottom of the column. 7. Remove the mouthpiece from your mouth and breathe out normally. 8. Rest for a few seconds and repeat Steps 1 through 7 at least 10 times every 1-2 hours when you are awake. Take your time and take a few normal breaths between deep breaths. 9. The spirometer may include an indicator to show your best effort. Use the indicator as a goal to work toward during each repetition. 10. After each set of 10 deep breaths, practice coughing to be sure your lungs are clear. If you have an incision (the cut made at the time of surgery), support your incision when coughing by placing a pillow or rolled up towels firmly against it. Once you are able to get out of bed, walk around indoors and cough well. You may stop using the incentive spirometer when instructed by your caregiver.  RISKS AND COMPLICATIONS  Take your time so you do not get dizzy or light-headed.  If you are in pain, you may need to take or ask for pain medication before doing incentive spirometry. It is harder to take a deep breath if you are having pain. AFTER USE  Rest and breathe slowly and easily.  It can be helpful to keep track of a log of your progress. Your caregiver can provide you with a simple table to help with this. If you are using the spirometer at home, follow these instructions: [place] IF:   You are  having difficultly using the spirometer.  You have trouble using the spirometer as often as instructed.  Your pain medication is not giving enough relief while using the spirometer.  You develop fever of 100.5 F (38.1 C) or higher. SEEK IMMEDIATE MEDICAL CARE IF:   You cough up bloody sputum that had not been present before.  You develop fever of 102 F (38.9 C) or greater.  You develop worsening pain at or near the incision site. MAKE SURE YOU:   Understand these instructions.  Will watch your condition.  Will get help right away if you are not doing well or get worse. Document Released: 09/28/2006 Document Revised: 08/10/2011 Document Reviewed: 11/29/2006 [facility] Patient Information 2014 [place], [place].   ________________________________________________________________________

## 2018-05-18 ENCOUNTER — Encounter (HOSPITAL_COMMUNITY): Payer: Self-pay

## 2018-05-18 ENCOUNTER — Encounter (INDEPENDENT_AMBULATORY_CARE_PROVIDER_SITE_OTHER): Payer: Self-pay

## 2018-05-18 ENCOUNTER — Other Ambulatory Visit: Payer: Self-pay

## 2018-05-18 ENCOUNTER — Encounter (HOSPITAL_COMMUNITY)
Admission: RE | Admit: 2018-05-18 | Discharge: 2018-05-18 | Disposition: A | Discharge status: Home / Self Care | Payer: BC Managed Care – PPO | Source: Ambulatory Visit | Attending: General Surgery | Admitting: General Surgery

## 2018-05-18 DIAGNOSIS — Z01818 Encounter for other preprocedural examination: Secondary | ICD-10-CM | POA: Insufficient documentation

## 2018-05-18 HISTORY — DX: Gastro-esophageal reflux disease without esophagitis: K21.9

## 2018-05-18 LAB — CBC
HCT: 46.6 % (ref 39.0–52.0)
Hemoglobin: 15.5 g/dL (ref 13.0–17.0)
MCH: 28.3 pg (ref 26.0–34.0)
MCHC: 33.3 g/dL (ref 30.0–36.0)
MCV: 85 fL (ref 80.0–100.0)
Platelets: 245 10*3/uL (ref 150–400)
RBC: 5.48 MIL/uL (ref 4.22–5.81)
RDW: 12.9 % (ref 11.5–15.5)
WBC: 7.3 10*3/uL (ref 4.0–10.5)
nRBC: 0 % (ref 0.0–0.2)

## 2018-05-18 LAB — ABO/RH: ABO/RH(D): A POS

## 2018-05-18 LAB — BASIC METABOLIC PANEL
Anion gap: 6 (ref 5–15)
BUN: 15 mg/dL (ref 6–20)
CALCIUM: 9.3 mg/dL (ref 8.9–10.3)
CO2: 29 mmol/L (ref 22–32)
Chloride: 104 mmol/L (ref 98–111)
Creatinine, Ser: 0.93 mg/dL (ref 0.61–1.24)
Glucose, Bld: 111 mg/dL — ABNORMAL HIGH (ref 70–99)
Potassium: 4.4 mmol/L (ref 3.5–5.1)
Sodium: 139 mmol/L (ref 135–145)

## 2018-05-19 LAB — CEA: CEA: 1.7 ng/mL (ref 0.0–4.7)

## 2018-05-19 MED ORDER — BUPIVACAINE LIPOSOME 1.3 % IJ SUSP
20.0000 mL | INTRAMUSCULAR | Status: DC
  Filled 2018-05-19: qty 20

## 2018-05-19 NOTE — Anesthesia Preprocedure Evaluation (Addendum)
Anesthesia Evaluation  Patient identified by MRN, date of birth, ID band Patient awake    Reviewed: Allergy & Precautions, NPO status , Patient's Chart, lab work & pertinent test results  Airway Mallampati: II  TM Distance: >3 FB Neck ROM: Full    Dental no notable dental hx.    Pulmonary neg pulmonary ROS, former smoker,    Pulmonary exam normal breath sounds clear to auscultation       Cardiovascular hypertension, Normal cardiovascular exam Rhythm:Regular Rate:Normal     Neuro/Psych negative neurological ROS  negative psych ROS   GI/Hepatic Neg liver ROS, GERD  Medicated,  Endo/Other  negative endocrine ROS  Renal/GU negative Renal ROS  negative genitourinary   Musculoskeletal negative musculoskeletal ROS (+)   Abdominal   Peds negative pediatric ROS (+)  Hematology negative hematology ROS (+)   Anesthesia Other Findings   Reproductive/Obstetrics negative OB ROS                             Anesthesia Physical Anesthesia Plan  ASA: II  Anesthesia Plan: General   Post-op Pain Management:    Induction: Intravenous  PONV Risk Score and Plan: 2 and Ondansetron, Dexamethasone and Treatment may vary due to age or medical condition  Airway Management Planned: Oral ETT  Additional Equipment:   Intra-op Plan:   Post-operative Plan: Extubation in OR  Informed Consent: I have reviewed the patients History and Physical, chart, labs and discussed the procedure including the risks, benefits and alternatives for the proposed anesthesia with the patient or authorized representative who has indicated his/her understanding and acceptance.   Dental advisory given  Plan Discussed with: CRNA and Surgeon  Anesthesia Plan Comments:         Anesthesia Quick Evaluation  

## 2018-05-20 ENCOUNTER — Encounter (HOSPITAL_COMMUNITY)
Admission: RE | Disposition: A | Discharge status: Home / Self Care | Payer: Self-pay | Source: Ambulatory Visit | Attending: General Surgery

## 2018-05-20 ENCOUNTER — Other Ambulatory Visit: Payer: Self-pay

## 2018-05-20 ENCOUNTER — Encounter (HOSPITAL_COMMUNITY): Payer: Self-pay

## 2018-05-20 ENCOUNTER — Inpatient Hospital Stay (HOSPITAL_COMMUNITY): Payer: BC Managed Care – PPO | Admitting: Anesthesiology

## 2018-05-20 ENCOUNTER — Inpatient Hospital Stay (HOSPITAL_COMMUNITY)
Admission: RE | Admit: 2018-05-20 | Discharge: 2018-05-22 | DRG: 331 | Disposition: A | Discharge status: Home / Self Care | Payer: BC Managed Care – PPO | Source: Ambulatory Visit | Attending: General Surgery | Admitting: General Surgery

## 2018-05-20 DIAGNOSIS — E669 Obesity, unspecified: Secondary | ICD-10-CM | POA: Diagnosis present

## 2018-05-20 DIAGNOSIS — C186 Malignant neoplasm of descending colon: Principal | ICD-10-CM | POA: Diagnosis present

## 2018-05-20 DIAGNOSIS — K219 Gastro-esophageal reflux disease without esophagitis: Secondary | ICD-10-CM | POA: Diagnosis present

## 2018-05-20 DIAGNOSIS — Z8371 Family history of colonic polyps: Secondary | ICD-10-CM

## 2018-05-20 DIAGNOSIS — C189 Malignant neoplasm of colon, unspecified: Secondary | ICD-10-CM | POA: Diagnosis present

## 2018-05-20 DIAGNOSIS — I10 Essential (primary) hypertension: Secondary | ICD-10-CM | POA: Diagnosis present

## 2018-05-20 DIAGNOSIS — Z87891 Personal history of nicotine dependence: Secondary | ICD-10-CM

## 2018-05-20 DIAGNOSIS — Z79899 Other long term (current) drug therapy: Secondary | ICD-10-CM | POA: Diagnosis not present

## 2018-05-20 DIAGNOSIS — Z6839 Body mass index (BMI) 39.0-39.9, adult: Secondary | ICD-10-CM

## 2018-05-20 HISTORY — PX: COLON SURGERY: SHX602

## 2018-05-20 LAB — TYPE AND SCREEN
ABO/RH(D): A POS
Antibody Screen: NEGATIVE

## 2018-05-20 SURGERY — COLECTOMY, PARTIAL, ROBOT-ASSISTED, LAPAROSCOPIC
Anesthesia: General

## 2018-05-20 MED ORDER — LACTATED RINGERS IV SOLN
INTRAVENOUS | Status: DC
  Administered 2018-05-20 (×2): via INTRAVENOUS

## 2018-05-20 MED ORDER — LIDOCAINE 2% (20 MG/ML) 5 ML SYRINGE
INTRAMUSCULAR | Status: AC
  Filled 2018-05-20: qty 5

## 2018-05-20 MED ORDER — PHENYLEPHRINE 40 MCG/ML (10ML) SYRINGE FOR IV PUSH (FOR BLOOD PRESSURE SUPPORT)
PREFILLED_SYRINGE | INTRAVENOUS | Status: AC
  Filled 2018-05-20: qty 10

## 2018-05-20 MED ORDER — ROCURONIUM BROMIDE 10 MG/ML (PF) SYRINGE
PREFILLED_SYRINGE | INTRAVENOUS | Status: AC
  Filled 2018-05-20: qty 10

## 2018-05-20 MED ORDER — PROMETHAZINE HCL 25 MG/ML IJ SOLN: 6.2500 mg | INTRAMUSCULAR | Status: DC | PRN

## 2018-05-20 MED ORDER — KCL IN DEXTROSE-NACL 20-5-0.45 MEQ/L-%-% IV SOLN
INTRAVENOUS | Status: DC
  Administered 2018-05-20 – 2018-05-21 (×2): via INTRAVENOUS
  Filled 2018-05-20 (×2): qty 1000

## 2018-05-20 MED ORDER — BUPIVACAINE HCL (PF) 0.25 % IJ SOLN
INTRAMUSCULAR | Status: DC | PRN
  Administered 2018-05-20: 30 mL

## 2018-05-20 MED ORDER — ONDANSETRON HCL 4 MG PO TABS: 4.0000 mg | ORAL_TABLET | Freq: Four times a day (QID) | ORAL | Status: DC | PRN

## 2018-05-20 MED ORDER — ALUM & MAG HYDROXIDE-SIMETH 200-200-20 MG/5ML PO SUSP
30.0000 mL | Freq: Four times a day (QID) | ORAL | Status: DC | PRN
  Administered 2018-05-21: 30 mL via ORAL
  Filled 2018-05-20: qty 30

## 2018-05-20 MED ORDER — DEXAMETHASONE SODIUM PHOSPHATE 10 MG/ML IJ SOLN
INTRAMUSCULAR | Status: AC
  Filled 2018-05-20: qty 1

## 2018-05-20 MED ORDER — MIDAZOLAM HCL 2 MG/2ML IJ SOLN
INTRAMUSCULAR | Status: DC | PRN
  Administered 2018-05-20: 2 mg via INTRAVENOUS

## 2018-05-20 MED ORDER — KETAMINE HCL 10 MG/ML IJ SOLN
INTRAMUSCULAR | Status: DC | PRN
  Administered 2018-05-20: 10 mg via INTRAVENOUS
  Administered 2018-05-20: 60 mg via INTRAVENOUS

## 2018-05-20 MED ORDER — ONDANSETRON HCL 4 MG/2ML IJ SOLN
INTRAMUSCULAR | Status: DC | PRN
  Administered 2018-05-20: 4 mg via INTRAVENOUS

## 2018-05-20 MED ORDER — KETAMINE HCL 10 MG/ML IJ SOLN
INTRAMUSCULAR | Status: AC
  Filled 2018-05-20: qty 1

## 2018-05-20 MED ORDER — SACCHAROMYCES BOULARDII 250 MG PO CAPS
250.0000 mg | ORAL_CAPSULE | Freq: Two times a day (BID) | ORAL | Status: DC
  Administered 2018-05-20 – 2018-05-22 (×4): 250 mg via ORAL
  Filled 2018-05-20 (×4): qty 1

## 2018-05-20 MED ORDER — 0.9 % SODIUM CHLORIDE (POUR BTL) OPTIME
TOPICAL | Status: DC | PRN
  Administered 2018-05-20: 2000 mL

## 2018-05-20 MED ORDER — ENOXAPARIN SODIUM 40 MG/0.4ML ~~LOC~~ SOLN
40.0000 mg | SUBCUTANEOUS | Status: DC
  Administered 2018-05-21 – 2018-05-22 (×2): 40 mg via SUBCUTANEOUS
  Filled 2018-05-20 (×2): qty 0.4

## 2018-05-20 MED ORDER — ALVIMOPAN 12 MG PO CAPS
12.0000 mg | ORAL_CAPSULE | Freq: Two times a day (BID) | ORAL | Status: DC
  Administered 2018-05-21: 12 mg via ORAL
  Filled 2018-05-20 (×2): qty 1

## 2018-05-20 MED ORDER — PROPOFOL 10 MG/ML IV BOLUS
INTRAVENOUS | Status: AC
  Filled 2018-05-20: qty 40

## 2018-05-20 MED ORDER — ENSURE SURGERY PO LIQD
237.0000 mL | Freq: Two times a day (BID) | ORAL | Status: DC
  Administered 2018-05-21 (×2): 237 mL via ORAL
  Filled 2018-05-20 (×5): qty 237

## 2018-05-20 MED ORDER — SUGAMMADEX SODIUM 500 MG/5ML IV SOLN
INTRAVENOUS | Status: AC
  Filled 2018-05-20: qty 5

## 2018-05-20 MED ORDER — FENTANYL CITRATE (PF) 100 MCG/2ML IJ SOLN: 25.0000 ug | INTRAMUSCULAR | Status: DC | PRN

## 2018-05-20 MED ORDER — BUPIVACAINE-EPINEPHRINE (PF) 0.25% -1:200000 IJ SOLN
INTRAMUSCULAR | Status: AC
  Filled 2018-05-20: qty 30

## 2018-05-20 MED ORDER — LIDOCAINE 2% (20 MG/ML) 5 ML SYRINGE
INTRAMUSCULAR | Status: DC | PRN
  Administered 2018-05-20: 1.5 mg/kg/h via INTRAVENOUS

## 2018-05-20 MED ORDER — IRBESARTAN 150 MG PO TABS
150.0000 mg | ORAL_TABLET | Freq: Every day | ORAL | Status: DC
  Filled 2018-05-20 (×2): qty 1

## 2018-05-20 MED ORDER — EPHEDRINE SULFATE-NACL 50-0.9 MG/10ML-% IV SOSY
PREFILLED_SYRINGE | INTRAVENOUS | Status: DC | PRN
  Administered 2018-05-20: 10 mg via INTRAVENOUS

## 2018-05-20 MED ORDER — SUGAMMADEX SODIUM 500 MG/5ML IV SOLN
INTRAVENOUS | Status: DC | PRN
  Administered 2018-05-20: 230 mg via INTRAVENOUS

## 2018-05-20 MED ORDER — GLYCOPYRROLATE PF 0.2 MG/ML IJ SOSY
PREFILLED_SYRINGE | INTRAMUSCULAR | Status: AC
  Filled 2018-05-20: qty 1

## 2018-05-20 MED ORDER — HYDROCHLOROTHIAZIDE 25 MG PO TABS
12.5000 mg | ORAL_TABLET | Freq: Every day | ORAL | Status: DC
  Filled 2018-05-20 (×2): qty 1

## 2018-05-20 MED ORDER — ONDANSETRON HCL 4 MG/2ML IJ SOLN
4.0000 mg | Freq: Four times a day (QID) | INTRAMUSCULAR | Status: DC | PRN
  Administered 2018-05-20: 4 mg via INTRAVENOUS
  Filled 2018-05-20: qty 2

## 2018-05-20 MED ORDER — FENTANYL CITRATE (PF) 250 MCG/5ML IJ SOLN
INTRAMUSCULAR | Status: AC
  Filled 2018-05-20: qty 5

## 2018-05-20 MED ORDER — KETOROLAC TROMETHAMINE 30 MG/ML IJ SOLN
INTRAMUSCULAR | Status: AC
  Filled 2018-05-20: qty 1

## 2018-05-20 MED ORDER — SODIUM CHLORIDE 0.9 % IV SOLN
2.0000 g | INTRAVENOUS | Status: AC
  Administered 2018-05-20: 2 g via INTRAVENOUS
  Filled 2018-05-20: qty 2

## 2018-05-20 MED ORDER — HYDROMORPHONE HCL 1 MG/ML IJ SOLN
0.5000 mg | INTRAMUSCULAR | Status: DC | PRN
  Administered 2018-05-20: 0.5 mg via INTRAVENOUS
  Filled 2018-05-20 (×2): qty 0.5

## 2018-05-20 MED ORDER — FENTANYL CITRATE (PF) 100 MCG/2ML IJ SOLN
INTRAMUSCULAR | Status: AC
  Filled 2018-05-20: qty 2

## 2018-05-20 MED ORDER — ALVIMOPAN 12 MG PO CAPS
12.0000 mg | ORAL_CAPSULE | ORAL | Status: AC
  Administered 2018-05-20: 12 mg via ORAL
  Filled 2018-05-20: qty 1

## 2018-05-20 MED ORDER — ATROPINE SULFATE 1 MG/ML IJ SOLN
INTRAMUSCULAR | Status: DC | PRN
  Administered 2018-05-20: 0.4 mg via INTRAVENOUS

## 2018-05-20 MED ORDER — TRAMADOL HCL 50 MG PO TABS
50.0000 mg | ORAL_TABLET | Freq: Four times a day (QID) | ORAL | Status: DC | PRN
  Filled 2018-05-20: qty 1

## 2018-05-20 MED ORDER — KETOROLAC TROMETHAMINE 30 MG/ML IJ SOLN
30.0000 mg | Freq: Once | INTRAMUSCULAR | Status: AC | PRN
  Administered 2018-05-20: 30 mg via INTRAVENOUS

## 2018-05-20 MED ORDER — OXYCODONE HCL 5 MG/5ML PO SOLN
5.0000 mg | Freq: Once | ORAL | Status: DC | PRN
  Filled 2018-05-20: qty 5

## 2018-05-20 MED ORDER — SODIUM CHLORIDE 0.9 % IV SOLN
2.0000 g | Freq: Two times a day (BID) | INTRAVENOUS | Status: AC
  Administered 2018-05-20: 2 g via INTRAVENOUS
  Filled 2018-05-20: qty 2

## 2018-05-20 MED ORDER — MIDAZOLAM HCL 2 MG/2ML IJ SOLN
INTRAMUSCULAR | Status: AC
  Filled 2018-05-20: qty 2

## 2018-05-20 MED ORDER — FAMOTIDINE 20 MG PO TABS
20.0000 mg | ORAL_TABLET | Freq: Two times a day (BID) | ORAL | Status: DC
  Administered 2018-05-20 – 2018-05-22 (×4): 20 mg via ORAL
  Filled 2018-05-20 (×4): qty 1

## 2018-05-20 MED ORDER — ACETAMINOPHEN 500 MG PO TABS
1000.0000 mg | ORAL_TABLET | Freq: Four times a day (QID) | ORAL | Status: DC
  Administered 2018-05-20 – 2018-05-22 (×7): 1000 mg via ORAL
  Filled 2018-05-20 (×8): qty 2

## 2018-05-20 MED ORDER — LIDOCAINE HCL (CARDIAC) PF 100 MG/5ML IV SOSY
PREFILLED_SYRINGE | INTRAVENOUS | Status: DC | PRN
  Administered 2018-05-20: 60 mg via INTRAVENOUS

## 2018-05-20 MED ORDER — PROMETHAZINE HCL 25 MG/ML IJ SOLN
12.5000 mg | INTRAMUSCULAR | Status: DC | PRN
  Administered 2018-05-20: 12.5 mg via INTRAVENOUS
  Filled 2018-05-20: qty 1

## 2018-05-20 MED ORDER — PHENYLEPHRINE 40 MCG/ML (10ML) SYRINGE FOR IV PUSH (FOR BLOOD PRESSURE SUPPORT)
PREFILLED_SYRINGE | INTRAVENOUS | Status: DC | PRN
  Administered 2018-05-20: 80 ug via INTRAVENOUS
  Administered 2018-05-20: 120 ug via INTRAVENOUS
  Administered 2018-05-20: 80 ug via INTRAVENOUS
  Administered 2018-05-20: 120 ug via INTRAVENOUS
  Administered 2018-05-20: 80 ug via INTRAVENOUS

## 2018-05-20 MED ORDER — OXYCODONE HCL 5 MG PO TABS: 5.0000 mg | ORAL_TABLET | Freq: Once | ORAL | Status: DC | PRN

## 2018-05-20 MED ORDER — EPHEDRINE 5 MG/ML INJ
INTRAVENOUS | Status: AC
  Filled 2018-05-20: qty 10

## 2018-05-20 MED ORDER — FENTANYL CITRATE (PF) 250 MCG/5ML IJ SOLN
INTRAMUSCULAR | Status: DC | PRN
  Administered 2018-05-20 (×3): 50 ug via INTRAVENOUS
  Administered 2018-05-20: 25 ug via INTRAVENOUS
  Administered 2018-05-20: 50 ug via INTRAVENOUS
  Administered 2018-05-20: 25 ug via INTRAVENOUS
  Administered 2018-05-20: 100 ug via INTRAVENOUS

## 2018-05-20 MED ORDER — ONDANSETRON HCL 4 MG/2ML IJ SOLN
INTRAMUSCULAR | Status: AC
  Filled 2018-05-20: qty 2

## 2018-05-20 MED ORDER — GABAPENTIN 300 MG PO CAPS
300.0000 mg | ORAL_CAPSULE | Freq: Two times a day (BID) | ORAL | Status: DC
  Administered 2018-05-20 – 2018-05-22 (×4): 300 mg via ORAL
  Filled 2018-05-20 (×4): qty 1

## 2018-05-20 MED ORDER — PROPOFOL 10 MG/ML IV BOLUS
INTRAVENOUS | Status: DC | PRN
  Administered 2018-05-20: 200 mg via INTRAVENOUS

## 2018-05-20 MED ORDER — ATROPINE SULFATE 1 MG/10ML IJ SOSY
PREFILLED_SYRINGE | INTRAMUSCULAR | Status: AC
  Filled 2018-05-20: qty 10

## 2018-05-20 MED ORDER — GLYCOPYRROLATE 0.2 MG/ML IJ SOLN
INTRAMUSCULAR | Status: DC | PRN
  Administered 2018-05-20: 0.2 mg via INTRAVENOUS

## 2018-05-20 MED ORDER — LACTATED RINGERS IR SOLN
Status: DC | PRN
  Administered 2018-05-20: 1000 mL

## 2018-05-20 MED ORDER — GABAPENTIN 300 MG PO CAPS
300.0000 mg | ORAL_CAPSULE | ORAL | Status: AC
  Administered 2018-05-20: 300 mg via ORAL
  Filled 2018-05-20: qty 1

## 2018-05-20 MED ORDER — DEXAMETHASONE SODIUM PHOSPHATE 10 MG/ML IJ SOLN
INTRAMUSCULAR | Status: DC | PRN
  Administered 2018-05-20: 8 mg via INTRAVENOUS

## 2018-05-20 MED ORDER — ROCURONIUM BROMIDE 10 MG/ML (PF) SYRINGE
PREFILLED_SYRINGE | INTRAVENOUS | Status: DC | PRN
  Administered 2018-05-20: 70 mg via INTRAVENOUS
  Administered 2018-05-20 (×2): 20 mg via INTRAVENOUS

## 2018-05-20 MED ORDER — ACETAMINOPHEN 500 MG PO TABS
1000.0000 mg | ORAL_TABLET | ORAL | Status: AC
  Administered 2018-05-20: 1000 mg via ORAL
  Filled 2018-05-20: qty 2

## 2018-05-20 MED ORDER — BUPIVACAINE LIPOSOME 1.3 % IJ SUSP
INTRAMUSCULAR | Status: DC | PRN
  Administered 2018-05-20: 20 mL

## 2018-05-20 SURGICAL SUPPLY — 98 items
BLADE EXTENDED COATED 6.5IN (ELECTRODE) IMPLANT
CANNULA REDUC XI 12-8 STAPL (CANNULA) ×1
CANNULA REDUC XI 12-8MM STAPL (CANNULA) ×1
CANNULA REDUCER 12-8 DVNC XI (CANNULA) ×1 IMPLANT
CELLS DAT CNTRL 66122 CELL SVR (MISCELLANEOUS) IMPLANT
CLIP VESOLOCK LG 6/CT PURPLE (CLIP) IMPLANT
CLIP VESOLOCK MED 6/CT (CLIP) IMPLANT
COVER SURGICAL LIGHT HANDLE (MISCELLANEOUS) ×6 IMPLANT
COVER TIP SHEARS 8 DVNC (MISCELLANEOUS) ×1 IMPLANT
COVER TIP SHEARS 8MM DA VINCI (MISCELLANEOUS) ×2
COVER WAND RF STERILE (DRAPES) ×3 IMPLANT
DERMABOND ADVANCED (GAUZE/BANDAGES/DRESSINGS) ×2
DERMABOND ADVANCED .7 DNX12 (GAUZE/BANDAGES/DRESSINGS) ×1 IMPLANT
DRAIN CHANNEL 19F RND (DRAIN) IMPLANT
DRAPE ARM DVNC X/XI (DISPOSABLE) ×4 IMPLANT
DRAPE COLUMN DVNC XI (DISPOSABLE) ×1 IMPLANT
DRAPE DA VINCI XI ARM (DISPOSABLE) ×8
DRAPE DA VINCI XI COLUMN (DISPOSABLE) ×2
DRAPE SURG IRRIG POUCH 19X23 (DRAPES) ×3 IMPLANT
DRSG OPSITE POSTOP 4X10 (GAUZE/BANDAGES/DRESSINGS) IMPLANT
DRSG OPSITE POSTOP 4X6 (GAUZE/BANDAGES/DRESSINGS) ×3 IMPLANT
DRSG OPSITE POSTOP 4X8 (GAUZE/BANDAGES/DRESSINGS) IMPLANT
ELECT PENCIL ROCKER SW 15FT (MISCELLANEOUS) ×3 IMPLANT
ELECT REM PT RETURN 15FT ADLT (MISCELLANEOUS) ×3 IMPLANT
ENDOLOOP SUT PDS II  0 18 (SUTURE)
ENDOLOOP SUT PDS II 0 18 (SUTURE) IMPLANT
EVACUATOR SILICONE 100CC (DRAIN) IMPLANT
GAUZE SPONGE 4X4 12PLY STRL (GAUZE/BANDAGES/DRESSINGS) IMPLANT
GLOVE BIO SURGEON STRL SZ 6.5 (GLOVE) ×6 IMPLANT
GLOVE BIO SURGEON STRL SZ7.5 (GLOVE) ×6 IMPLANT
GLOVE BIO SURGEONS STRL SZ 6.5 (GLOVE) ×3
GLOVE BIOGEL PI IND STRL 7.0 (GLOVE) ×4 IMPLANT
GLOVE BIOGEL PI IND STRL 7.5 (GLOVE) ×2 IMPLANT
GLOVE BIOGEL PI INDICATOR 7.0 (GLOVE) ×8
GLOVE BIOGEL PI INDICATOR 7.5 (GLOVE) ×4
GLOVE ECLIPSE 6.5 STRL STRAW (GLOVE) ×6 IMPLANT
GLOVE INDICATOR 8.0 STRL GRN (GLOVE) ×6 IMPLANT
GOWN STRL REUS W/TWL 2XL LVL3 (GOWN DISPOSABLE) ×9 IMPLANT
GOWN STRL REUS W/TWL XL LVL3 (GOWN DISPOSABLE) ×15 IMPLANT
GRASPER ENDOPATH ANVIL 10MM (MISCELLANEOUS) IMPLANT
GRASPER SUT TROCAR 14GX15 (MISCELLANEOUS) IMPLANT
HOLDER FOLEY CATH W/STRAP (MISCELLANEOUS) ×3 IMPLANT
IRRIG SUCT STRYKERFLOW 2 WTIP (MISCELLANEOUS) ×3
IRRIGATION SUCT STRKRFLW 2 WTP (MISCELLANEOUS) ×1 IMPLANT
IRRIGATOR SUCT 8 DISP DVNC XI (IRRIGATION / IRRIGATOR) IMPLANT
IRRIGATOR SUCTION 8MM XI DISP (IRRIGATION / IRRIGATOR)
KIT PROCEDURE DA VINCI SI (MISCELLANEOUS)
KIT PROCEDURE DVNC SI (MISCELLANEOUS) IMPLANT
NEEDLE INSUFFLATION 14GA 120MM (NEEDLE) ×3 IMPLANT
PACK CARDIOVASCULAR III (CUSTOM PROCEDURE TRAY) ×3 IMPLANT
PACK COLON (CUSTOM PROCEDURE TRAY) ×3 IMPLANT
PORT LAP GEL ALEXIS MED 5-9CM (MISCELLANEOUS) ×3 IMPLANT
RTRCTR WOUND ALEXIS 18CM MED (MISCELLANEOUS)
SCISSORS LAP 5X35 DISP (ENDOMECHANICALS) ×3 IMPLANT
SEAL CANN UNIV 5-8 DVNC XI (MISCELLANEOUS) ×4 IMPLANT
SEAL XI 5MM-8MM UNIVERSAL (MISCELLANEOUS) ×8
SEALER VESSEL DA VINCI XI (MISCELLANEOUS) ×2
SEALER VESSEL EXT DVNC XI (MISCELLANEOUS) ×1 IMPLANT
SLEEVE ADV FIXATION 5X100MM (TROCAR) IMPLANT
SOLUTION ELECTROLUBE (MISCELLANEOUS) ×3 IMPLANT
SPONGE LAP 18X18 X RAY DECT (DISPOSABLE) ×3 IMPLANT
STAPLER 45 BLU RELOAD XI (STAPLE) ×2 IMPLANT
STAPLER 45 BLUE RELOAD XI (STAPLE) ×4
STAPLER 45 GREEN RELOAD XI (STAPLE)
STAPLER 45 GRN RELOAD XI (STAPLE) IMPLANT
STAPLER CANNULA SEAL DVNC XI (STAPLE) ×1 IMPLANT
STAPLER CANNULA SEAL XI (STAPLE) ×2
STAPLER SHEATH (SHEATH) ×2
STAPLER SHEATH ENDOWRIST DVNC (SHEATH) ×1 IMPLANT
STAPLER VISISTAT 35W (STAPLE) IMPLANT
SUT ETHILON 2 0 PS N (SUTURE) IMPLANT
SUT NOVA NAB DX-16 0-1 5-0 T12 (SUTURE) ×6 IMPLANT
SUT PROLENE 2 0 KS (SUTURE) ×3 IMPLANT
SUT SILK 2 0 (SUTURE) ×2
SUT SILK 2 0 SH CR/8 (SUTURE) IMPLANT
SUT SILK 2-0 18XBRD TIE 12 (SUTURE) ×1 IMPLANT
SUT SILK 3 0 (SUTURE) ×2
SUT SILK 3 0 SH CR/8 (SUTURE) IMPLANT
SUT SILK 3-0 18XBRD TIE 12 (SUTURE) ×1 IMPLANT
SUT V-LOC BARB 180 2/0GR6 GS22 (SUTURE)
SUT VIC AB 2-0 SH 18 (SUTURE) ×3 IMPLANT
SUT VIC AB 2-0 SH 27 (SUTURE) ×2
SUT VIC AB 2-0 SH 27X BRD (SUTURE) ×1 IMPLANT
SUT VIC AB 3-0 SH 18 (SUTURE) IMPLANT
SUT VIC AB 4-0 PS2 18 (SUTURE) ×6 IMPLANT
SUT VIC AB 4-0 PS2 27 (SUTURE) ×6 IMPLANT
SUT VICRYL 0 UR6 27IN ABS (SUTURE) ×3 IMPLANT
SUTURE V-LC BRB 180 2/0GR6GS22 (SUTURE) IMPLANT
SYR 10ML ECCENTRIC (SYRINGE) ×3 IMPLANT
SYS LAPSCP GELPORT 120MM (MISCELLANEOUS)
SYSTEM LAPSCP GELPORT 120MM (MISCELLANEOUS) IMPLANT
TOWEL OR 17X26 10 PK STRL BLUE (TOWEL DISPOSABLE) ×3 IMPLANT
TOWEL OR NON WOVEN STRL DISP B (DISPOSABLE) ×3 IMPLANT
TRAY FOLEY MTR SLVR 16FR STAT (SET/KITS/TRAYS/PACK) ×3 IMPLANT
TROCAR ADV FIXATION 5X100MM (TROCAR) ×3 IMPLANT
TUBING CONNECTING 10 (TUBING) ×4 IMPLANT
TUBING CONNECTING 10' (TUBING) ×2
TUBING INSUFFLATION 10FT LAP (TUBING) ×3 IMPLANT

## 2018-05-20 NOTE — Op Note (Signed)
05/20/2018  10:21 AM  PATIENT:  Russell Novak [facility]  47 y.o. male  Patient Care Team: Susy Frizzle, MD as PCP - General (Family Medicine)  PRE-OPERATIVE DIAGNOSIS:  Colon cancer  POST-OPERATIVE DIAGNOSIS:  Colon cancer  PROCEDURE:   XI ROBOT ASSISTED SIGMOIDECTOMY    Surgeon(s): Leighton Ruff, MD Ileana Roup, MD  ASSISTANT: Dr Dema Severin   ANESTHESIA:   local and general  EBL: 78ml Total I/O In: 1000 [I.V.:1000] Out: 200 [Urine:150; Blood:50]  Delay start of Pharmacological VTE agent (>24hrs) due to surgical blood loss or risk of bleeding:  no  DRAINS: none   SPECIMEN:  Source of Specimen:  Sigmoid colon  DISPOSITION OF SPECIMEN:  PATHOLOGY  COUNTS:  YES  PLAN OF CARE: Admit to inpatient   PATIENT DISPOSITION:  PACU - hemodynamically stable.  INDICATION:    47 y.o. 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  I recommended segmental resection:  The anatomy & physiology of the digestive tract was discussed.  The pathophysiology was discussed.  Natural history risks without surgery was discussed.   I worked to give an overview of the disease and the frequent need to have multispecialty involvement.  I feel the risks of no intervention will lead to serious problems that outweigh the operative risks; therefore, I recommended a partial colectomy to remove the pathology.  Laparoscopic & open techniques were discussed.   Risks such as bleeding, infection, abscess, leak, reoperation, possible ostomy, hernia, heart attack, death, and other risks were discussed.  I noted a good likelihood this will help address the problem.   Goals of post-operative recovery were discussed as well.    The patient expressed understanding & wished to proceed with surgery.  OR FINDINGS:   Patient had tattoo in distal transverse colon, distal descending colon and sigmoid colon.    No obvious metastatic disease on visceral parietal peritoneum or  liver.  The anastomosis rests ~20 cm from the anal verge by rigid proctoscopy.  DESCRIPTION:   Informed consent was confirmed.  The patient underwent general anaesthesia without difficulty.  The patient was positioned appropriately.  VTE prevention in place.  The patient's abdomen was clipped, prepped, & draped in a sterile fashion.  Surgical timeout confirmed our plan.  The patient was positioned in reverse Trendelenburg.  Abdominal entry was gained using a Varies needle in the LUQ.  Entry was clean.  I induced carbon dioxide insufflation.  An 5mm robotic port was placed in the RUQ.  Camera inspection revealed no injury.  Extra ports were carefully placed under direct laparoscopic visualization.  I laparoscopically reflected the greater omentum and the upper abdomen the small bowel in the upper abdomen. The patient was appropriately positioned and the robot was docked to the patient's left side.  Instruments were placed under direct visualization.    I mobilized the sigmoid colon off of the pelvic sidewall.  I identified tattoo in distal transverse colon, distal descending colon and proximal sigmoid colon.  I decided to take the 2 most distal tattoo sites and perform a standard sigmoidectomy.  I scored the base of peritoneum of the right side of the mesentery of the left colon from the ligament of Treitz to the peritoneal reflection of the mid rectum.  I elevated the sigmoid mesentery and enetered into the retro-mesenteric plane. We were able to identify the left ureter and gonadal vessels. We kept those posterior within the retroperitoneum and elevated the left colon mesentery off that. I did isolated  IMA pedicle but did not ligate it yet.  I continued distally and got into the avascular plane posterior to the mesorectum. This allowed me to help mobilize the rectum as well by freeing the mesorectum off the sacrum.  I mobilized the peritoneal coverings towards the peritoneal reflection on both the right  and left sides of the rectum.  I could see the right and left ureters and stayed away from them.    I skeletonized the inferior mesenteric artery pedicle.  I went down to its takeoff from the aorta.   I isolated the inferior mesenteric vein off of the ligament of Treitz just cephalad to that as well.  After confirming the left ureter was out of the way, I went ahead and ligated the inferior mesenteric artery pedicle with bipolar robotic vessel sealer ~2cm above its takeoff from the aorta.   We ensured hemostasis. I skeletonized the mesorectum at the junction at the proximal rectum using blunt dissection & bipolar robotic vessel sealer.  I mobilized the left colon in a lateral to medial fashion off the line of T[place]t up towards the splenic flexure to ensure good mobilization of the left colon to reach into the pelvis.  I divided the mesentery to the level of the most proximal descending colon tattoo.  The robot was then undocked.  A Pfannenstiel incision was made.  An [place] wound protector was placed.  The colon was brought out through the incision and a pursestring device was placed on the proximal descending colon.  The colon was transected using electrocautery.  A 2-0 Prolene pursestring suture was placed.  This was secured with 3-0 silk sutures.  A 31 mm EEA anvil was then placed into the colon and the pursestring was tied tightly around this.  This was then placed back into the abdomen and the abdomen was reinsufflated.  An anastomosis was created with the EEA stapler through the distal rectum under laparoscopic visualization.  There was no leak when tested with insufflation under irrigation.  The abdomen was irrigated.  Hemostasis was good.  The omentum was then brought down over the abdominal contents and the laparoscopic ports and Alexis wound protector were removed.  We switched to clean gowns, gloves, instruments and drapes.  The peritoneum of the Pfannenstiel incision was closed using a running 0  Vicryl suture.  The fascia was closed using interrupted #1 Novafil sutures.  The subcutaneous tissue was reapproximated with a running 2-0 Vicryl suture and the skin was closed using a running 4-0 Vicryl subcuticular suture.  A sterile dressing was applied.  The remaining port sites were closed using 4-0 Vicryl subcuticular sutures and Dermabond.  The patient was then awakened from anesthesia and sent to the postanesthesia care unit stable condition.  All counts were correct per operating room staff.  An MD assistant was necessary for tissue manipulation, retraction and positioning due to the complexity of the case and hospital policies

## 2018-05-20 NOTE — H&P (Signed)
The patient is a 47 year old male who presents with colorectal cancer. 47 year old male who presents to the office after colonoscopy was performed for rectal bleeding. He was noted to have a hemorrhoid as the source of his rectal bleeding but several polyps were also removed. The most distal polyp in the descending colon was significant for a poorly invasive adenocarcinoma. Margins were negative. CT scans of the chest abdomen and pelvis have been performed and showed no signs of metastatic disease. His CEA is normal.   Past Surgical History Mammie Lorenzo, LPN; 29/06/9164 06:00 AM) Colon Polyp Removal - Colonoscopy  Diagnostic Studies History Mammie Lorenzo, LPN; 45/01/9773 14:23 AM) Colonoscopy within last year  Allergies Mammie Lorenzo, LPN; 95/07/2021 34:35 AM) No Known Drug Allergies [04/04/2018]:  Medication History Mammie Lorenzo, LPN; 68/10/1681 72:90 AM) Telmisartan (40MG  Tablet, Oral) Active. Medications Reconciled  Social History Mammie Lorenzo, LPN; 21/06/1550 08:02 AM) Alcohol use Occasional alcohol use. Caffeine use Tea. No drug use Tobacco use Former smoker.  Family History Mammie Lorenzo, LPN; 23/07/6120 44:97 AM) Colon Polyps Father. Migraine Headache Son.  Other Problems Mammie Lorenzo, LPN; 53/0/0511 02:11 AM) High blood pressure     Review of Systems  General Not Present- Appetite Loss, Chills, Fatigue, Fever, Night Sweats, Weight Gain and Weight Loss. HEENT Present- Ringing in the Ears and Wears glasses/contact lenses. Not Present- Earache, Hearing Loss, Hoarseness, Nose Bleed, Oral Ulcers, Seasonal Allergies, Sinus Pain, Sore Throat, Visual Disturbances and Yellow Eyes. Respiratory Present- Snoring. Not Present- Bloody sputum, Chronic Cough, Difficulty Breathing and Wheezing. Breast Not Present- Breast Mass, Breast Pain, Nipple Discharge and Skin Changes. Gastrointestinal Present- Bloody Stool and Indigestion. Not Present- Abdominal  Pain, Bloating, Change in Bowel Habits, Chronic diarrhea, Constipation, Difficulty Swallowing, Excessive gas, Gets full quickly at meals, Hemorrhoids, Nausea, Rectal Pain and Vomiting. Neurological Not Present- Decreased Memory, Fainting, Headaches, Numbness, Seizures, Tingling, Tremor, Trouble walking and Weakness.  BP 125/76   Pulse 79   Temp 98 F (36.7 C) (Oral)   Resp 18   Ht 5\' 8"  (1.727 m)   Wt 112.9 kg   SpO2 97%   BMI 37.86 kg/m        Physical Exam   General Mental Status-Alert. General Appearance-Not in acute distress. Build & Nutrition-Well nourished. Posture-Normal posture. Gait-Normal.  Head and Neck Head-normocephalic, atraumatic with no lesions or palpable masses. Trachea-midline.  Chest and Lung Exam Chest and lung exam reveals -on auscultation, normal breath sounds, no adventitious sounds and normal vocal resonance.  Cardiovascular Cardiovascular examination reveals -normal heart sounds, regular rate and rhythm with no murmurs and no digital clubbing, cyanosis, edema, increased warmth or tenderness.  Abdomen Inspection Inspection of the abdomen reveals - No Hernias. Palpation/Percussion Palpation and Percussion of the abdomen reveal - Soft, Non Tender, No Rigidity (guarding), No hepatosplenomegaly and No Palpable abdominal masses.  Neurologic Neurologic evaluation reveals -alert and oriented x 3 with no impairment of recent or remote memory, normal attention span and ability to concentrate, normal sensation and normal coordination.  Musculoskeletal Normal Exam - Bilateral-Upper Extremity Strength Normal and Lower Extremity Strength Normal.    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 This is a high-risk polyp and surgery is recommended. The area is tattooed as the most distal  polyp. This most likely will be a sigmoidectomy. We discussed the risk of a high-risk polyp having lymph node metastases to be approximately 20%. The surgery and  anatomy were described to the patient as well as the risks of surgery and the possible complications. These include: Bleeding, deep abdominal infections and possible wound complications such as hernia and infection, damage to adjacent structures, leak of surgical connections, which can lead to other surgeries and possibly an ostomy, possible need for other procedures, such as abscess drains in radiology, possible prolonged hospital stay, possible diarrhea from removal of part of the colon, possible constipation from narcotics, possible bowel, bladder or sexual dysfunction if having rectal surgery, prolonged fatigue/weakness or appetite loss, possible early recurrence of of disease, possible complications of their medical problems such as heart disease or arrhythmias or lung problems, death (less than 1%). I believe the patient understands and wishes to proceed with the surgery.

## 2018-05-20 NOTE — Transfer of Care (Signed)
Immediate Anesthesia Transfer of Care Note  Patient: [facility]  Procedure(s) Performed: XI ROBOT ASSISTED SIGMOIDECTOMY, RIGID PROCTOSCOPY (N/A )  Patient Location: PACU  Anesthesia Type:General  Level of Consciousness: awake, alert , oriented and patient cooperative  Airway & Oxygen Therapy: Patient Spontanous Breathing and Patient connected to face mask oxygen  Post-op Assessment: Report given to RN and Post -op Vital signs reviewed and stable  Post vital signs: Reviewed and stable  Last Vitals:  Vitals Value Taken Time  BP 141/98 05/20/2018 10:31 AM  Temp    Pulse 86 05/20/2018 10:35 AM  Resp 12 05/20/2018 10:35 AM  SpO2 98 % 05/20/2018 10:35 AM  Vitals shown include unvalidated device data.  Last Pain:  Vitals:   05/20/18 0604  TempSrc:   PainSc: 0-No pain         Complications: No apparent anesthesia complications

## 2018-05-20 NOTE — Anesthesia Procedure Notes (Signed)
Procedure Name: Intubation Date/Time: 05/20/2018 7:39 AM Performed by: Raenette Rover, CRNA Pre-anesthesia Checklist: Patient identified, Emergency Drugs available, Suction available and Patient being monitored Patient Re-evaluated:Patient Re-evaluated prior to induction Oxygen Delivery Method: Circle system utilized Preoxygenation: Pre-oxygenation with 100% oxygen Induction Type: IV induction Ventilation: Mask ventilation without difficulty Laryngoscope Size: Mac and 4 Grade View: Grade I Tube type: Oral Tube size: 8.0 mm Number of attempts: 1 Airway Equipment and Method: Stylet Placement Confirmation: ETT inserted through vocal cords under direct vision,  positive ETCO2,  CO2 detector and breath sounds checked- equal and bilateral Secured at: 23 cm Tube secured with: Tape Dental Injury: Teeth and Oropharynx as per pre-operative assessment

## 2018-05-21 LAB — CBC
HCT: 36.3 % — ABNORMAL LOW (ref 39.0–52.0)
Hemoglobin: 11.7 g/dL — ABNORMAL LOW (ref 13.0–17.0)
MCH: 28.5 pg (ref 26.0–34.0)
MCHC: 32.2 g/dL (ref 30.0–36.0)
MCV: 88.3 fL (ref 80.0–100.0)
Platelets: 223 10*3/uL (ref 150–400)
RBC: 4.11 MIL/uL — ABNORMAL LOW (ref 4.22–5.81)
RDW: 13.2 % (ref 11.5–15.5)
WBC: 13.7 10*3/uL — ABNORMAL HIGH (ref 4.0–10.5)
nRBC: 0 % (ref 0.0–0.2)

## 2018-05-21 LAB — BASIC METABOLIC PANEL
Anion gap: 8 (ref 5–15)
BUN: 13 mg/dL (ref 6–20)
CO2: 24 mmol/L (ref 22–32)
Calcium: 8.1 mg/dL — ABNORMAL LOW (ref 8.9–10.3)
Chloride: 104 mmol/L (ref 98–111)
Creatinine, Ser: 1.05 mg/dL (ref 0.61–1.24)
GFR calc Af Amer: >60 mL/min (ref >60)
GFR calc non Af Amer: >60 mL/min (ref >60)
Glucose, Bld: 124 mg/dL — ABNORMAL HIGH (ref 70–99)
Potassium: 3.6 mmol/L (ref 3.5–5.1)
Sodium: 136 mmol/L (ref 135–145)

## 2018-05-21 NOTE — Progress Notes (Signed)
1 Day Post-Op Robotic sigmoidectomy Subjective: Had some PO nausea yesterday.  Better this am.  Passing some flatus  Objective: Vital signs in last 24 hours: Temp:  [97.4 F (36.3 C)-98.6 F (37 C)] 98.2 F (36.8 C) (12/21 0543) Pulse Rate:  [66-91] 66 (12/21 0543) Resp:  [10-19] 16 (12/21 0543) BP: (95-141)/(54-98) 105/58 (12/21 0543) SpO2:  [92 %-100 %] 97 % (12/21 0543) Weight:  [114.9 kg] 114.9 kg (12/21 0543)   Intake/Output from previous day: 12/20 0701 - 12/21 0700 In: 3359.3 [P.O.:200; I.V.:3159.3] Out: 2100 [Urine:2050; Blood:50] Intake/Output this shift: No intake/output data recorded.   General appearance: alert and cooperative GI: normal findings: soft, non-tender, non-distended  Incision: no significant drainage  Lab Results:  Recent Labs    05/21/18 0404  WBC 13.7*  HGB 11.7*  HCT 36.3*  PLT 223   BMET Recent Labs    05/21/18 0404  NA 136  K 3.6  CL 104  CO2 24  GLUCOSE 124*  BUN 13  CREATININE 1.05  CALCIUM 8.1*   PT/INR No results for input(s): LABPROT, INR in the last 72 hours. ABG No results for input(s): PHART, HCO3 in the last 72 hours.  Invalid input(s): PCO2, PO2  MEDS, Scheduled . acetaminophen  1,000 mg Oral Q6H  . alvimopan  12 mg Oral BID  . enoxaparin (LOVENOX) injection  40 mg Subcutaneous Q24H  . famotidine  20 mg Oral BID  . feeding supplement  237 mL Oral BID BM  . gabapentin  300 mg Oral BID  . hydrochlorothiazide  12.5 mg Oral Daily  . irbesartan  150 mg Oral Daily  . saccharomyces boulardii  250 mg Oral BID    Studies/Results: No results found.  Assessment: s/p Procedure(s): XI ROBOT ASSISTED SIGMOIDECTOMY, RIGID PROCTOSCOPY Patient Active Problem List   Diagnosis Date Noted  . Colon cancer ([place])   . Obesity, unspecified 08/19/2012  . Hypertension     Expected post op course  Plan: d/c foley Advance diet as tolerated Ambulate    LOS: 1 day     .Russell Novak, [place]  [facility], [place]   05/21/2018 8:39 AM

## 2018-05-21 NOTE — Plan of Care (Signed)
Patient resting in bed this morning. No complaints of pain at this time. Will continue to monitor.

## 2018-05-22 LAB — CBC
HCT: 35 % — ABNORMAL LOW (ref 39.0–52.0)
Hemoglobin: 11.1 g/dL — ABNORMAL LOW (ref 13.0–17.0)
MCH: 27.6 pg (ref 26.0–34.0)
MCHC: 31.7 g/dL (ref 30.0–36.0)
MCV: 87.1 fL (ref 80.0–100.0)
Platelets: 207 10*3/uL (ref 150–400)
RBC: 4.02 MIL/uL — ABNORMAL LOW (ref 4.22–5.81)
RDW: 13.2 % (ref 11.5–15.5)
WBC: 11.4 10*3/uL — ABNORMAL HIGH (ref 4.0–10.5)
nRBC: 0 % (ref 0.0–0.2)

## 2018-05-22 LAB — BASIC METABOLIC PANEL
Anion gap: 6 (ref 5–15)
BUN: 10 mg/dL (ref 6–20)
CO2: 26 mmol/L (ref 22–32)
Calcium: 8.1 mg/dL — ABNORMAL LOW (ref 8.9–10.3)
Chloride: 108 mmol/L (ref 98–111)
Creatinine, Ser: 0.81 mg/dL (ref 0.61–1.24)
GFR calc Af Amer: >60 mL/min (ref >60)
GFR calc non Af Amer: >60 mL/min (ref >60)
Glucose, Bld: 93 mg/dL (ref 70–99)
Potassium: 3.7 mmol/L (ref 3.5–5.1)
Sodium: 140 mmol/L (ref 135–145)

## 2018-05-22 MED ORDER — TRAMADOL HCL 50 MG PO TABS: 50.0000 mg | ORAL_TABLET | Freq: Four times a day (QID) | ORAL | 0 refills | Status: DC | PRN

## 2018-05-22 NOTE — Progress Notes (Signed)
Reviewed discharge instructions with patient and spouse; copy given. IV removed. Patient ready for discharge.

## 2018-05-22 NOTE — Discharge Summary (Signed)
Physician Discharge Summary  Patient ID: Russell Novak [facility] MRN: [id number] DOB/AGE: Apr 07, 1971 47 y.o.  Admit date: 05/20/2018 Discharge date: 05/22/2018  Admission Diagnoses: Colon cancer  Discharge Diagnoses:  Active Problems:   Colon cancer [facility])   Discharged Condition: good  Hospital Course: Pt admitted after robotic sigmoidectomy.  He had some post op nausea that resolved by the am of POD 1.  His diet was advanced as tolerated.  By POD 2 he was tolerating a diet and having bowel function.  His pain was controlled and he was urinating and ambulating without difficulty.    Consults: None  Significant Diagnostic Studies: labs: cbc, bmet  Treatments: IV hydration, analgesia: acetaminophen and surgery: see above  Discharge Exam: Blood pressure (!) 125/59, pulse 79, temperature 98.3 F (36.8 C), temperature source Oral, resp. rate 16, height 5\' 8"  (1.727 m), weight 117.2 kg, SpO2 97 %. General appearance: alert and cooperative GI: normal findings: soft, non-tender Incision/Wound: clean, dry, intact  Disposition: home   Allergies as of 05/22/2018   No Known Allergies     Medication List    TAKE these medications   hydrochlorothiazide 12.5 MG tablet Commonly known as:  HYDRODIURIL Take 1 tablet (12.5 mg total) by mouth daily.   lisinopril 20 MG tablet Commonly known as:  PRINIVIL,ZESTRIL Take 1 tablet (20 mg total) by mouth daily.   ranitidine 150 MG tablet Commonly known as:  ZANTAC Take 150 mg by mouth 2 (two) times daily.   telmisartan 40 MG tablet Commonly known as:  MICARDIS TAKE 1 TABLET BY MOUTH EVERY DAY What changed:    how much to take  how to take this  when to take this   traMADol 50 MG tablet Commonly known as:  ULTRAM Take 1 tablet (50 mg total) by mouth every 6 (six) hours as needed (mild pain).      Follow-up Information    Leighton Ruff, MD. Schedule an appointment as soon as possible for a visit in 2 week(s).   Specialty:   General Surgery Contact information: [address] [place] [address] [phone]           Signed: Rosario Adie 54/98/2641, 9:10 AM

## 2018-05-22 NOTE — Plan of Care (Signed)
Patient resting in bed this morning. States he had a good night; no complaints of pain at this time. Will continue to monitor.

## 2018-05-22 NOTE — Discharge Instructions (Signed)

## 2018-05-23 NOTE — Anesthesia Postprocedure Evaluation (Signed)
Anesthesia Post Note  Patient: Russell Novak [facility]  Procedure(s) Performed: XI ROBOT ASSISTED SIGMOIDECTOMY, RIGID PROCTOSCOPY (N/A )     Patient location during evaluation: PACU Anesthesia Type: General Level of consciousness: awake and alert Pain management: pain level controlled Vital Signs Assessment: post-procedure vital signs reviewed and stable Respiratory status: spontaneous breathing, nonlabored ventilation, respiratory function stable and patient connected to nasal cannula oxygen Cardiovascular status: blood pressure returned to baseline and stable Postop Assessment: no apparent nausea or vomiting Anesthetic complications: no    Last Vitals:  Vitals:   05/21/18 2240 05/22/18 0604  BP: 115/65 (!) 125/59  Pulse: 71 79  Resp: 16 16  Temp: 36.8 C 36.8 C  SpO2: 97% 97%    Last Pain:  Vitals:   05/22/18 0845  TempSrc:   PainSc: 3                  Janielle Mittelstadt S

## 2018-06-07 ENCOUNTER — Telehealth: Payer: Self-pay | Admitting: Gastroenterology

## 2018-06-07 NOTE — Telephone Encounter (Signed)
I rec'd office note from Dr. Leighton Ruff of [facility] in follow up from patient's sigmoid resection on 05/20/18.  Stage 1 colon cancer on final path  Please set patient's colonoscopy recall with me for December 2020.  - H. Loletha Carrow, MD

## 2018-06-07 NOTE — Telephone Encounter (Signed)
Recall is already set for 05-2019

## 2018-06-30 ENCOUNTER — Other Ambulatory Visit: Payer: Self-pay | Admitting: Family Medicine

## 2018-10-30 ENCOUNTER — Other Ambulatory Visit: Payer: Self-pay | Admitting: Family Medicine

## 2018-12-06 ENCOUNTER — Other Ambulatory Visit: Payer: Self-pay | Admitting: Family Medicine

## 2019-03-14 ENCOUNTER — Other Ambulatory Visit: Payer: Self-pay | Admitting: Family Medicine

## 2019-03-15 ENCOUNTER — Other Ambulatory Visit: Payer: Self-pay | Admitting: Family Medicine

## 2019-03-17 ENCOUNTER — Telehealth: Payer: Self-pay | Admitting: Family Medicine

## 2019-03-17 NOTE — Telephone Encounter (Signed)
Patient has appointment on Monday for his med refills, however he is out and needs a couple until Monday if possible  [facility] hydrochlorothiazide

## 2019-03-20 ENCOUNTER — Other Ambulatory Visit: Payer: Self-pay

## 2019-03-20 ENCOUNTER — Ambulatory Visit (INDEPENDENT_AMBULATORY_CARE_PROVIDER_SITE_OTHER): Payer: BC Managed Care – PPO | Admitting: Family Medicine

## 2019-03-20 ENCOUNTER — Encounter: Payer: Self-pay | Admitting: Family Medicine

## 2019-03-20 VITALS — BP 130/90 | HR 72 | Temp 97.8°F | Resp 18 | Ht 68.0 in | Wt 267.0 lb

## 2019-03-20 DIAGNOSIS — I10 Essential (primary) hypertension: Secondary | ICD-10-CM | POA: Diagnosis not present

## 2019-03-20 DIAGNOSIS — C20 Malignant neoplasm of rectum: Secondary | ICD-10-CM

## 2019-03-20 DIAGNOSIS — Z23 Encounter for immunization: Secondary | ICD-10-CM | POA: Diagnosis not present

## 2019-03-20 MED ORDER — HYDROCHLOROTHIAZIDE 12.5 MG PO TABS: 12.5000 mg | ORAL_TABLET | Freq: Every day | ORAL | 3 refills | Status: DC

## 2019-03-20 MED ORDER — TELMISARTAN 40 MG PO TABS: 40.0000 mg | ORAL_TABLET | Freq: Every day | ORAL | 3 refills | Status: DC

## 2019-03-20 NOTE — Progress Notes (Signed)
Subjective:    Patient ID: Russell Novak [facility], male    DOB: 1971-02-23, 48 y.o.   MRN: [id number]  HPI  Patient is here today to follow-up his hypertension.  Since I last saw the patient he was diagnosed with rectal cancer.  The had to remove approximately 12 inches of his colon per his report.  They wanted to repeat a colonoscopy in 1 year.  He is now 1 year from his previous colonoscopy.  He states that he has not yet heard from his gastroenterologist.  With the COVID-19 pandemic, I explained that there may be a delay in scheduling.  Therefore I will make a referral back to GI to ensure that his colonoscopy is scheduled.  Otherwise he is doing well.  He does report occasional episodes of bright red blood per rectum.  However on his colonoscopy, in addition to the rectal cancer and polyps, the patient was found to have internal hemorrhoids.  He denies any chest pain shortness of breath or dyspnea on exertion.  He is due for his flu shot.  Past Medical History:  Diagnosis Date  . Colon cancer ([place])    in polyp removed during colonoscopy 03/2018, planned sigmoidectomy  . GERD (gastroesophageal reflux disease)    occasional  . Hypertension   . Seizures ([place])    childhood   Past Surgical History:  Procedure Laterality Date  . COLONOSCOPY    . TONSILLECTOMY     Current Outpatient Medications on File Prior to Visit  Medication Sig Dispense Refill  . famotidine (PEPCID) 20 MG tablet Take 20 mg by mouth daily.    . hydrochlorothiazide (HYDRODIURIL) 12.5 MG tablet Take 1 tablet (12.5 mg total) by mouth daily. 90 tablet 3  . telmisartan (MICARDIS) 40 MG tablet Take 1 tablet (40 mg total) by mouth daily. 90 tablet 3   No current facility-administered medications on file prior to visit.    No Known Allergies Social History   Socioeconomic History  . Marital status: Married    Spouse name: Not on file  . Number of children: 2  . Years of education: Not on file  . Highest education  level: Not on file  Occupational History  . Occupation: Financial planner  Social Needs  . Financial resource strain: Not on file  . Food insecurity    Worry: Not on file    Inability: Not on file  . Transportation needs    Medical: Not on file    Non-medical: Not on file  Tobacco Use  . Smoking status: Former Smoker    Types: Cigarettes    Quit date: 2007    Years since quitting: 13.8  . Smokeless tobacco: Never Used  . Tobacco comment: quit 2005  Substance and Sexual Activity  . Alcohol use: Yes    Comment: occasional  . Drug use: No  . Sexual activity: Yes    Partners: Female  Lifestyle  . Physical activity    Days per week: Not on file    Minutes per session: Not on file  . Stress: Not on file  Relationships  . Social Herbalist on phone: Not on file    Gets together: Not on file    Attends religious service: Not on file    Active member of club or organization: Not on file    Attends meetings of clubs or organizations: Not on file    Relationship status: Not on file  . Intimate partner violence  Fear of current or ex partner: Not on file    Emotionally abused: Not on file    Physically abused: Not on file    Forced sexual activity: Not on file  Other Topics Concern  . Not on file  Social History Narrative  . Not on file   Family History  Problem Relation Age of Onset  . Lung cancer Mother   . Other Father        neck tumor  . Esophageal cancer Paternal Uncle   . Breast cancer Paternal Aunt   . Prostate cancer Paternal Uncle   . Diabetes Paternal Uncle   . Stroke Paternal Uncle   . Rectal cancer Neg Hx   . Colitis Neg Hx   ' He does have a cousin who was recently diagnosed with prostate cancer. Review of Systems  Gastrointestinal: Positive for anal bleeding and blood in stool.  All other systems reviewed and are negative.      Objective:   Physical Exam Vitals signs reviewed.  Constitutional:      General: He is not in acute  distress.    Appearance: He is well-developed. He is not diaphoretic.  HENT:     Head: Normocephalic and atraumatic.     Right Ear: External ear normal.     Left Ear: External ear normal.     Nose: Nose normal.     Mouth/Throat:     Pharynx: No oropharyngeal exudate.  Eyes:     General: No scleral icterus.       Right eye: No discharge.        Left eye: No discharge.     Conjunctiva/sclera: Conjunctivae normal.     Pupils: Pupils are equal, round, and reactive to light.  Neck:     Musculoskeletal: Normal range of motion and neck supple.     Thyroid: No thyromegaly.     Vascular: No JVD.     Trachea: No tracheal deviation.  Cardiovascular:     Rate and Rhythm: Normal rate and regular rhythm.     Heart sounds: Normal heart sounds. No murmur. No friction rub. No gallop.   Pulmonary:     Effort: Pulmonary effort is normal. No respiratory distress.     Breath sounds: Normal breath sounds. No stridor. No wheezing or rales.  Chest:     Chest wall: No tenderness.  Abdominal:     General: Bowel sounds are normal. There is no distension.     Palpations: Abdomen is soft. There is no mass.     Tenderness: There is no abdominal tenderness. There is no guarding.  Musculoskeletal: Normal range of motion.        General: No tenderness or deformity.  Lymphadenopathy:     Cervical: No cervical adenopathy.  Skin:    General: Skin is warm.     Coloration: Skin is not pale.     Findings: No erythema or rash.  Neurological:     Mental Status: He is alert and oriented to person, place, and time.     Cranial Nerves: No cranial nerve deficit.     Sensory: No sensory deficit.     Motor: No abnormal muscle tone.     Coordination: Coordination normal.     Deep Tendon Reflexes: Reflexes normal.           Assessment & Plan:  Benign essential HTN - Plan: CBC with Differential/Platelet, COMPLETE METABOLIC PANEL WITH GFR, Lipid panel  Rectal cancer (HCC) - Plan: Ambulatory referral  to  Gastroenterology  Blood pressure today is adequately controlled.  We will continue the patient on telmisartan and hydrochlorothiazide.  I will check a CBC, CMP, and a fasting lipid panel.  Goal LDL cholesterol is less than 100.  Patient received his flu shot.  He is due for follow-up colonoscopy.  I will contact his gastroenterologist to ensure that this gets scheduled.  He is due for prostate cancer screening next year.  Otherwise patient is doing well with no concerns.

## 2019-03-20 NOTE — Addendum Note (Signed)
Addended by: Shary Decamp B on: 03/20/2019 02:05 PM   Modules accepted: Orders

## 2019-03-21 LAB — LIPID PANEL
Cholesterol: 200 mg/dL — ABNORMAL HIGH (ref <200)
HDL: 44 mg/dL (ref >=40)
LDL Cholesterol (Calc): 134 mg/dL (calc) — ABNORMAL HIGH
Non-HDL Cholesterol (Calc): 156 mg/dL (calc) — ABNORMAL HIGH (ref <130)
Total CHOL/HDL Ratio: 4.5 (calc) (ref <5.0)
Triglycerides: 113 mg/dL (ref <150)

## 2019-03-21 LAB — COMPLETE METABOLIC PANEL WITH GFR
AG Ratio: 1.9 (calc) (ref 1.0–2.5)
ALT: 26 U/L (ref 9–46)
AST: 21 U/L (ref 10–40)
Albumin: 4.3 g/dL (ref 3.6–5.1)
Alkaline phosphatase (APISO): 49 U/L (ref 36–130)
BUN: 12 mg/dL (ref 7–25)
CO2: 26 mmol/L (ref 20–32)
Calcium: 9.4 mg/dL (ref 8.6–10.3)
Chloride: 104 mmol/L (ref 98–110)
Creat: 0.99 mg/dL (ref 0.60–1.35)
GFR, Est African American: 103 mL/min/{1.73_m2} (ref >=60)
GFR, Est Non African American: 89 mL/min/{1.73_m2} (ref >=60)
Globulin: 2.3 g/dL (calc) (ref 1.9–3.7)
Glucose, Bld: 91 mg/dL (ref 65–99)
Potassium: 4.3 mmol/L (ref 3.5–5.3)
Sodium: 139 mmol/L (ref 135–146)
Total Bilirubin: 0.4 mg/dL (ref 0.2–1.2)
Total Protein: 6.6 g/dL (ref 6.1–8.1)

## 2019-03-21 LAB — CBC WITH DIFFERENTIAL/PLATELET
Absolute Monocytes: 771 cells/uL (ref 200–950)
Basophils Absolute: 49 cells/uL (ref 0–200)
Basophils Relative: 0.6 %
Eosinophils Absolute: 246 cells/uL (ref 15–500)
Eosinophils Relative: 3 %
HCT: 46 % (ref 38.5–50.0)
Hemoglobin: 15.8 g/dL (ref 13.2–17.1)
Lymphs Abs: 2501 cells/uL (ref 850–3900)
MCH: 29 pg (ref 27.0–33.0)
MCHC: 34.3 g/dL (ref 32.0–36.0)
MCV: 84.6 fL (ref 80.0–100.0)
MPV: 11.1 fL (ref 7.5–12.5)
Monocytes Relative: 9.4 %
Neutro Abs: 4633 cells/uL (ref 1500–7800)
Neutrophils Relative %: 56.5 %
Platelets: 276 10*3/uL (ref 140–400)
RBC: 5.44 10*6/uL (ref 4.20–5.80)
RDW: 13.2 % (ref 11.0–15.0)
Total Lymphocyte: 30.5 %
WBC: 8.2 10*3/uL (ref 3.8–10.8)

## 2019-03-22 NOTE — Telephone Encounter (Signed)
Pt seen in office on 03/20/19 and meds refilled at that time.

## 2019-04-13 ENCOUNTER — Encounter: Payer: Self-pay | Admitting: Gastroenterology

## 2019-05-01 ENCOUNTER — Encounter: Payer: Self-pay | Admitting: Gastroenterology

## 2019-05-09 ENCOUNTER — Ambulatory Visit (AMBULATORY_SURGERY_CENTER): Payer: BC Managed Care – PPO | Admitting: *Deleted

## 2019-05-09 ENCOUNTER — Other Ambulatory Visit: Payer: Self-pay

## 2019-05-09 VITALS — Temp 97.0°F | Ht 68.0 in | Wt 270.0 lb

## 2019-05-09 DIAGNOSIS — Z1159 Encounter for screening for other viral diseases: Secondary | ICD-10-CM

## 2019-05-09 DIAGNOSIS — Z85038 Personal history of other malignant neoplasm of large intestine: Secondary | ICD-10-CM

## 2019-05-09 MED ORDER — PLENVU 140 G PO SOLR: 1.0000 | Freq: Once | ORAL | 0 refills | Status: AC

## 2019-05-09 NOTE — Progress Notes (Signed)
During PV the patient explained that he passed out during the colon prep last time he had the colonoscopy and when he did the prep before the colonoscopy, I went to Dr.Danis and explained this, Dr.Danis encouraged the patient to stay well hydrated before and after drinking the prep and to have his wife available if needed. I explained this to the patient and gave him our 24 hour emergency number to call if he needs [place] after hours. Pt understands.

## 2019-05-09 NOTE — Progress Notes (Signed)
Patient is here in-person for PV. Patient denies any allergies to eggs or soy. Patient denies any problems with anesthesia/sedation. Patient denies any oxygen use at home. Patient denies taking any diet/weight loss medications or blood thinners. Patient is not being treated for MRSA or C-diff. EMMI education assisgned to the patient for the procedure, this was explained and instructions given to patient. COVID-19 screening test is on 12/30, the pt is aware. Pt is aware that care partner will wait in the car during procedure; if they feel like they will be too hot or cold to wait in the car; they may wait in the 4 th floor lobby. Patient is aware to bring only one care partner. We want them to wear a mask (we do not have any that we can provide them), practice social distancing, and we will check their temperatures when they get here.  I did remind the patient that their care partner needs to stay in the parking lot the entire time and have a cell phone available, we will call them when the pt is ready for discharge. Patient will wear mask into building.    plenvu coupon given to the pt.

## 2019-05-10 ENCOUNTER — Encounter: Payer: Self-pay | Admitting: Gastroenterology

## 2019-05-15 ENCOUNTER — Encounter: Payer: BC Managed Care – PPO | Admitting: Gastroenterology

## 2019-05-31 ENCOUNTER — Other Ambulatory Visit: Payer: Self-pay | Admitting: Gastroenterology

## 2019-05-31 ENCOUNTER — Ambulatory Visit (INDEPENDENT_AMBULATORY_CARE_PROVIDER_SITE_OTHER): Payer: BC Managed Care – PPO

## 2019-05-31 DIAGNOSIS — Z1159 Encounter for screening for other viral diseases: Secondary | ICD-10-CM

## 2019-05-31 LAB — SARS CORONAVIRUS 2 (TAT 6-24 HRS): SARS Coronavirus 2: NEGATIVE

## 2019-06-05 ENCOUNTER — Ambulatory Visit (AMBULATORY_SURGERY_CENTER): Payer: BC Managed Care – PPO | Admitting: Gastroenterology

## 2019-06-05 ENCOUNTER — Other Ambulatory Visit: Payer: Self-pay

## 2019-06-05 ENCOUNTER — Encounter: Payer: Self-pay | Admitting: Gastroenterology

## 2019-06-05 ENCOUNTER — Encounter: Payer: BC Managed Care – PPO | Admitting: Gastroenterology

## 2019-06-05 VITALS — BP 127/81 | HR 69 | Temp 98.5°F | Resp 15 | Ht 68.0 in | Wt 270.0 lb

## 2019-06-05 DIAGNOSIS — Z85038 Personal history of other malignant neoplasm of large intestine: Secondary | ICD-10-CM

## 2019-06-05 MED ORDER — SODIUM CHLORIDE 0.9 % IV SOLN: 500.0000 mL | Freq: Once | INTRAVENOUS | Status: DC

## 2019-06-05 NOTE — Progress Notes (Signed)
Vitals-Donna Temp-Yesi  Pt's states no medical or surgical changes since previsit or office visit.

## 2019-06-05 NOTE — Op Note (Signed)
[facility] Patient Name: Brad Cuna Procedure Date: 06/05/2019 10:10 AM MRN: [id number] Endoscopist: Mallie Mussel L. 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                            Multiple additional adenomas on the 03/2018 exam as                            well) Medicines:                Monitored Anesthesia Care Procedure:                Pre-Anesthesia Assessment:                           - Prior to the procedure, a History and Physical                            was performed, and patient medications and                            allergies were reviewed. The patient's tolerance of                            previous anesthesia was also reviewed. The risks                            and benefits of the procedure and the sedation                            options and risks were discussed with the patient.                            All questions were answered, and informed consent                            was obtained. Prior Anticoagulants: The patient has  taken no previous anticoagulant or antiplatelet                            agents. ASA Grade Assessment: II - A patient with                            mild systemic disease. After reviewing the risks                            and benefits, the patient was deemed in                            satisfactory condition to undergo the procedure.                           After obtaining informed consent, the colonoscope                         was passed under direct vision. Throughout the                            procedure, the patient's blood pressure, pulse, and                            oxygen saturations were monitored continuously. The                            Colonoscope was introduced through the anus and                            advanced to the the cecum, identified by                            appendiceal orifice and ileocecal valve. The                            colonoscopy was performed without difficulty. The                            patient tolerated the procedure well. The quality                            of the bowel preparation was excellent. The                            ileocecal valve, appendiceal orifice, and rectum                            were photographed. The bowel preparation used was                            Plenvu. Scope In: 10:21:14 AM Scope Out: 10:35:07 AM Scope Withdrawal Time: 0 hours 10 minutes 13 seconds  Total Procedure Duration: 0 hours 13 minutes 53 seconds  Findings:  The perianal and digital rectal examinations were                            normal.                           A tattoo was seen in the distal transverse colon.                            The tattoo site appeared normal. (polypectomy site                            of pedunculated polyp proximal to the malignant                            polyp seen on last exam)                           There was evidence of a prior end-to-end                            colo-rectal anastomosis in the recto-sigmoid colon.                            This was patent and was characterized by healthy                            appearing mucosa.                           The exam was otherwise without abnormality on                            direct and retroflexion views. Complications:            No immediate complications. Estimated Blood Loss:     Estimated blood loss: none. Impression:                - A tattoo was seen in the distal transverse colon.                            The tattoo site appeared normal.                           - Patent end-to-end colo-rectal anastomosis,                            characterized by healthy appearing mucosa.                           - The examination was otherwise normal on direct                            and retroflexion views.                           -  No specimens collected. Recommendation:           - Patient has a contact number available for                            emergencies. The signs and symptoms of potential                            delayed complications were discussed with the                            patient. Return to normal activities tomorrow.                            Written discharge instructions were provided to the                            patient.                           - Resume previous diet.                           - Continue present medications.                           - Repeat colonoscopy in 3 years for surveillance. Callia Swim L. Loletha Carrow, MD 06/05/2019 10:43:31 AM This report has been signed electronically.

## 2019-06-05 NOTE — Patient Instructions (Signed)

## 2019-06-05 NOTE — Progress Notes (Signed)
To PACU, VSS. Report to [email or web address] 

## 2019-06-07 ENCOUNTER — Telehealth: Payer: Self-pay

## 2019-06-07 NOTE — Telephone Encounter (Signed)
  Follow up Call-  Call back number 06/05/2019 03/14/2018  Post procedure Call Back phone  # [phone]  Permission to leave phone message Yes Yes  Some recent data might be hidden     Patient questions:  Do you have a fever, pain , or abdominal swelling? No. Pain Score  0 *  Have you tolerated food without any problems? Yes  Have you been able to return to your normal activities? Yes.    Do you have any questions about your discharge instructions: Diet   No. Medications  No. Follow up visit  No.  Do you have questions or concerns about your Care? No.  Actions: * If pain score is 4 or above: No action needed, pain <4.  Pt answered negative to COVID screening questions.

## 2020-03-18 ENCOUNTER — Other Ambulatory Visit: Payer: Self-pay | Admitting: Family Medicine

## 2020-05-15 ENCOUNTER — Other Ambulatory Visit: Payer: Self-pay | Admitting: Family Medicine

## 2020-12-16 ENCOUNTER — Telehealth: Payer: Self-pay | Admitting: Gastroenterology

## 2020-12-16 NOTE — Telephone Encounter (Signed)
Left message on machine to call back  

## 2020-12-16 NOTE — Telephone Encounter (Signed)
Patient wife calling to inform patient is experiencing bloating/gas/abd pain/constipation/abd to touch feels hard..  Plz advise  thank you

## 2020-12-17 NOTE — Telephone Encounter (Signed)
Spoke with patient's wife, she states that patient has a history of colon cancer and states that patient has been having a lot of pain for the past couple of weeks. She reports abdominal pain and discomfort. She reports that patient has a hard abdomen. Denies a fever. She reports that patient's bowels are pretty regular, she states that he seems to gave a BM everyday. She is not sure if he has been dealing with constipation or straining. She states that he has noticed some BRB but he does have hemorrhoids also. Patient has been scheduled for a follow up with Dr. Loletha Carrow on Wednesday, 12/19/20 at 8:40 AM. Wife is aware of appt and had no concerns at the end of the call.

## 2020-12-18 ENCOUNTER — Encounter: Payer: Self-pay | Admitting: Gastroenterology

## 2020-12-18 ENCOUNTER — Other Ambulatory Visit (INDEPENDENT_AMBULATORY_CARE_PROVIDER_SITE_OTHER): Payer: BC Managed Care – PPO

## 2020-12-18 ENCOUNTER — Ambulatory Visit: Payer: BC Managed Care – PPO | Admitting: Gastroenterology

## 2020-12-18 VITALS — BP 132/74 | HR 69 | Ht 67.0 in | Wt 283.0 lb

## 2020-12-18 DIAGNOSIS — R1031 Right lower quadrant pain: Secondary | ICD-10-CM

## 2020-12-18 DIAGNOSIS — R140 Abdominal distension (gaseous): Secondary | ICD-10-CM | POA: Diagnosis not present

## 2020-12-18 LAB — URINALYSIS, ROUTINE W REFLEX MICROSCOPIC
Bilirubin Urine: NEGATIVE
Ketones, ur: NEGATIVE
Leukocytes,Ua: NEGATIVE
Nitrite: NEGATIVE
Specific Gravity, Urine: 1.02 (ref 1.000–1.030)
Total Protein, Urine: NEGATIVE
Urine Glucose: NEGATIVE
Urobilinogen, UA: 0.2 (ref 0.0–1.0)
WBC, UA: NONE SEEN (ref 0–?)
pH: 6.5 (ref 5.0–8.0)

## 2020-12-18 LAB — COMPREHENSIVE METABOLIC PANEL
ALT: 46 U/L (ref 0–53)
AST: 27 U/L (ref 0–37)
Albumin: 4.3 g/dL (ref 3.5–5.2)
Alkaline Phosphatase: 46 U/L (ref 39–117)
BUN: 16 mg/dL (ref 6–23)
CO2: 28 mEq/L (ref 19–32)
Calcium: 9.7 mg/dL (ref 8.4–10.5)
Chloride: 104 mEq/L (ref 96–112)
Creatinine, Ser: 0.97 mg/dL (ref 0.40–1.50)
GFR: 90.33 mL/min (ref >60.00)
Glucose, Bld: 97 mg/dL (ref 70–99)
Potassium: 4.3 mEq/L (ref 3.5–5.1)
Sodium: 140 mEq/L (ref 135–145)
Total Bilirubin: 0.4 mg/dL (ref 0.2–1.2)
Total Protein: 6.9 g/dL (ref 6.0–8.3)

## 2020-12-18 LAB — CBC WITH DIFFERENTIAL/PLATELET
Basophils Absolute: 0 10*3/uL (ref 0.0–0.1)
Basophils Relative: 0.5 % (ref 0.0–3.0)
Eosinophils Absolute: 0.3 10*3/uL (ref 0.0–0.7)
Eosinophils Relative: 4.1 % (ref 0.0–5.0)
HCT: 44.6 % (ref 39.0–52.0)
Hemoglobin: 15.7 g/dL (ref 13.0–17.0)
Lymphocytes Relative: 36 % (ref 12.0–46.0)
Lymphs Abs: 3 10*3/uL (ref 0.7–4.0)
MCHC: 35.2 g/dL (ref 30.0–36.0)
MCV: 83.1 fl (ref 78.0–100.0)
Monocytes Absolute: 0.7 10*3/uL (ref 0.1–1.0)
Monocytes Relative: 8.6 % (ref 3.0–12.0)
Neutro Abs: 4.3 10*3/uL (ref 1.4–7.7)
Neutrophils Relative %: 50.8 % (ref 43.0–77.0)
Platelets: 235 10*3/uL (ref 150.0–400.0)
RBC: 5.37 Mil/uL (ref 4.22–5.81)
RDW: 13.7 % (ref 11.5–15.5)
WBC: 8.5 10*3/uL (ref 4.0–10.5)

## 2020-12-18 LAB — TSH: TSH: 0.03 u[IU]/mL — ABNORMAL LOW (ref 0.35–5.50)

## 2020-12-18 NOTE — Progress Notes (Signed)
[facility] Progress Note  Chief Complaint: RLQ pain  Subjective  History: Russell Novak is seen for the first time since his last colonoscopy in January 2021. In October 2019 he was found to have adenocarcinoma and a left colon polyp, underwent resection for cure with no lymph nodes or additional therapy.  Last colonoscopy January 2021 with healthy-appearing colorectal anastomosis no polyps.  Recommendation for repeat exam in 3 years.  Russell Novak has had a year to year and a half of right lower quadrant pain that is now occurring most days and sometimes radiates up to the right flank.  He has generalized abdominal bloating and says his abdomen gets hard and he feels totally full of gas.  He has been taking Rolaids and other antacids trying to improve it, that does not seem to have helped but sometimes causes some constipation.  However, he is typically regular with a BM every morning after which he feels well evacuated.  He denies rectal bleeding, nausea or vomiting.  No dysuria or hematuria.  Although the RLQ pain is episodic it does not come on with urination.  Sometimes it hurts more bending over or lifting things.  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 In retrospect, he noticed some of this pain after the surgery, then it seemed to improve. However, he does not recall having had this degree of bloating and distention in the past.  He has been taking a probiotic for about the last year and a half, switched to a different one a few months ago but has not seen improvement.  ROS: Cardiovascular:  no chest pain Respiratory: no dyspnea Remainder of systems negative except as above The patient's Past Medical, Family and Social History were reviewed and are on file in the EMR.  Objective:  Med list reviewed  Current Outpatient Medications:    Ca Carbonate-Mag Hydroxide (ROLAIDS PO), Take by mouth as needed., Disp: , Rfl:    famotidine (PEPCID) 20 MG tablet, Take 20 mg  by mouth daily., Disp: , Rfl:    hydrochlorothiazide (HYDRODIURIL) 12.5 MG tablet, TAKE 1 TABLET BY MOUTH EVERY DAY, Disp: 90 tablet, Rfl: 3   Probiotic Product (PROBIOTIC-10 PO), Take by mouth daily., Disp: , Rfl:    Simethicone (GAS-X PO), Take by mouth daily as needed., Disp: , Rfl:    telmisartan (MICARDIS) 40 MG tablet, TAKE 1 TABLET BY MOUTH EVERY DAY, Disp: 90 tablet, Rfl: 3  No new medicines since I last saw him  Vital signs in last 24 hrs: Vitals:   12/18/20 0842  BP: 132/74  Pulse: 69  SpO2: 96%   Wt Readings from Last 3 Encounters:  12/18/20 283 lb (128.4 kg)  06/05/19 270 lb (122.5 kg)  05/09/19 270 lb (122.5 kg)    Physical Exam  Well-appearing HEENT: sclera anicteric, oral mucosa moist without lesions Neck: supple, no thyromegaly, JVD or lymphadenopathy Cardiac: RRR without murmurs, S1S2 heard, no peripheral edema Pulm: clear to auscultation bilaterally, normal RR and effort noted Abdomen: soft, morbidly obese, rectus diastasis, mild RLQ tenderness, with active bowel sounds. No guarding or palpable hepatosplenomegaly.  (Limited by abdominal girth).  Several laparoscopic scars.  No appreciable hernia supine or standing. Skin; warm and dry, no jaundice or rash  Labs:  Last PCP visit and labs Oct 2020 ___________________________________________ Radiologic studies:   ____________________________________________ Other:   _____________________________________________ Assessment & Plan  Assessment: Encounter Diagnoses  Name Primary?   RLQ abdominal pain Yes   Abdominal bloating  Symptoms difficulty characterize, sound mostly digestive.  Although it is radiating into the flank and episodic, no urinary symptoms, nor does the pain come on with urination to suggest kidney/ureteral stone.  No clear risk factors for SIBO, colonic surgery would not typically be 1 of those risk factors.  That surgery can change colonic motility, but he seems to have regular bowel  movements.  Plan: CBC, CMP, TSH and urinalysis  CT scan abdomen and pelvis with oral contrast only.  Stop probiotics, as it may be making bloating and gas worse.  30 minutes were spent on this encounter (including chart review, history/exam, counseling/coordination of care, and documentation) > 50% of that time was spent on counseling and coordination of care.  Topics discussed included: See above.  Nelida Meuse III

## 2020-12-18 NOTE — Patient Instructions (Signed)
If you are age 52 or older, your body mass index should be between 23-30. Your Body mass index is 44.32 kg/m. If this is out of the aforementioned range listed, please consider follow up with your Primary Care Provider.  If you are age 32 or younger, your body mass index should be between 19-25. Your Body mass index is 44.32 kg/m. If this is out of the aformentioned range listed, please consider follow up with your Primary Care Provider.   __________________________________________________________  The [facility] providers would like to encourage you to use [facility] to communicate with providers for non-urgent requests or questions.  Due to long hold times on the telephone, sending your provider a message by [facility] may be a faster and more efficient way to get a response.  Please allow 48 business hours for a response.  Please remember that this is for non-urgent requests.   Your provider has requested that you go to the basement level for lab work before leaving today. Press "B" on the elevator. The lab is located at the first door on the left as you exit the elevator.  You have been scheduled for a CT scan of the abdomen and pelvis at [facility] ([address]---this is in the same building as [employer]).   You are scheduled on 12-24-2020 at 2pm. You should arrive 15 minutes prior to your appointment time for registration. Please follow the written instructions below on the day of your exam:  WARNING: IF YOU ARE ALLERGIC TO IODINE/X-RAY DYE, PLEASE NOTIFY RADIOLOGY IMMEDIATELY AT [id number]! YOU WILL BE GIVEN A 13 HOUR PREMEDICATION PREP.  1) Do not eat or drink anything after 10am (4 hours prior to your test) 2) You have been given 2 bottles of oral contrast to drink. The solution may taste better if refrigerated, but do NOT add ice or any other liquid to this solution. Shake well before drinking.    Drink 1 bottle of contrast @ 12/noon (2 hours prior to your  exam)  Drink 1 bottle of contrast @ 1pm (1 hour prior to your exam)  You may take any medications as prescribed with a small amount of water, if necessary. If you take any of the following medications: METFORMIN, GLUCOPHAGE, GLUCOVANCE, AVANDAMET, RIOMET, FORTAMET, [place] MET, JANUMET, GLUMETZA or METAGLIP, you MAY be asked to HOLD this medication 48 hours AFTER the exam.  The purpose of you drinking the oral contrast is to aid in the visualization of your intestinal tract. The contrast solution may cause some diarrhea. Depending on your individual set of symptoms, you may also receive an intravenous injection of x-ray contrast/dye. Plan on being at [facility] for 30 minutes or longer, depending on the type of exam you are having performed.  This test typically takes 30-45 minutes to complete.  If you have any questions regarding your exam or if you need to reschedule, you may call the CT department at [phone] between the hours of 8:00 am and 5:00 pm, Monday-Friday.  It was a pleasure to see you today!  Thank you for trusting me with your gastrointestinal care!      ________________________________________________________________________

## 2020-12-24 ENCOUNTER — Encounter: Payer: Self-pay | Admitting: *Deleted

## 2020-12-24 ENCOUNTER — Other Ambulatory Visit: Payer: Self-pay | Admitting: *Deleted

## 2020-12-24 ENCOUNTER — Ambulatory Visit (INDEPENDENT_AMBULATORY_CARE_PROVIDER_SITE_OTHER)
Admission: RE | Admit: 2020-12-24 | Discharge: 2020-12-24 | Disposition: A | Discharge status: Home / Self Care | Payer: BC Managed Care – PPO | Source: Ambulatory Visit | Attending: Gastroenterology | Admitting: Gastroenterology

## 2020-12-24 ENCOUNTER — Other Ambulatory Visit: Payer: Self-pay

## 2020-12-24 DIAGNOSIS — R7989 Other specified abnormal findings of blood chemistry: Secondary | ICD-10-CM

## 2020-12-24 DIAGNOSIS — R1031 Right lower quadrant pain: Secondary | ICD-10-CM | POA: Diagnosis not present

## 2020-12-24 DIAGNOSIS — R140 Abdominal distension (gaseous): Secondary | ICD-10-CM

## 2020-12-27 ENCOUNTER — Other Ambulatory Visit: Payer: Self-pay

## 2020-12-27 ENCOUNTER — Other Ambulatory Visit: Payer: BC Managed Care – PPO

## 2020-12-27 DIAGNOSIS — R7989 Other specified abnormal findings of blood chemistry: Secondary | ICD-10-CM

## 2020-12-27 LAB — T4, FREE: Free T4: 1.2 ng/dL (ref 0.8–1.8)

## 2020-12-27 LAB — T3, FREE: T3, Free: 3.5 pg/mL (ref 2.3–4.2)

## 2020-12-31 LAB — TRAB (TSH RECEPTOR BINDING ANTIBODY): TRAB: <1 IU/L (ref <=2.00)

## 2021-01-06 ENCOUNTER — Ambulatory Visit: Payer: Self-pay | Admitting: Family Medicine

## 2021-03-24 ENCOUNTER — Other Ambulatory Visit: Payer: Self-pay | Admitting: Family Medicine

## 2021-04-15 ENCOUNTER — Ambulatory Visit: Payer: BC Managed Care – PPO | Admitting: Family Medicine

## 2021-04-15 ENCOUNTER — Other Ambulatory Visit: Payer: Self-pay

## 2021-04-15 ENCOUNTER — Encounter: Payer: Self-pay | Admitting: Family Medicine

## 2021-04-15 VITALS — BP 142/82 | HR 60 | Temp 98.0°F | Resp 18 | Ht 67.0 in | Wt 274.0 lb

## 2021-04-15 DIAGNOSIS — K6389 Other specified diseases of intestine: Secondary | ICD-10-CM | POA: Diagnosis not present

## 2021-04-15 DIAGNOSIS — I10 Essential (primary) hypertension: Secondary | ICD-10-CM | POA: Diagnosis not present

## 2021-04-15 DIAGNOSIS — E669 Obesity, unspecified: Secondary | ICD-10-CM

## 2021-04-15 DIAGNOSIS — C189 Malignant neoplasm of colon, unspecified: Secondary | ICD-10-CM | POA: Diagnosis not present

## 2021-04-15 MED ORDER — HYDROCHLOROTHIAZIDE 12.5 MG PO TABS: 12.5000 mg | ORAL_TABLET | Freq: Every day | ORAL | 3 refills | Status: DC

## 2021-04-15 MED ORDER — TELMISARTAN 40 MG PO TABS: 40.0000 mg | ORAL_TABLET | Freq: Every day | ORAL | 3 refills | Status: DC

## 2021-04-15 NOTE — Progress Notes (Signed)
Subjective:    Patient ID: Russell Novak [facility], male    DOB: [birth year], 50 y.o.   MRN: [id number]  Medication Refill   Patient is here today to follow-up his hypertension.  Patient has a history of rectal cancer.  He had surgical resection to remove approximately 12 inches of his colon near his rectum.  He is following up with GI.  Recently he saw GI back in July for some abdominal pain.  At that time they performed a CT scan which showed a mesenteric mass:  IMPRESSION: 1. New irregular infiltrative soft tissue process in the central mesentery with bandlike and frond like components and questionable cicatricial reaction. Possibilities include carcinoid tumor, metastatic disease to the mesentery, inflammatory pseudotumor, mesenteric hematoma, or an unusual case of sclerosing mesenteritis. Nuclear medicine PET-CT and biopsy may play a significant role in further workup. 2. Mild diffuse hepatic steatosis. 3. Other imaging findings of potential clinical significance: Small right adrenal adenoma. Bosniak category 2 cyst of the left kidney upper pole has mildly enlarged in the antrum. Aortic Atherosclerosis (ICD10-I70.0). Multilevel lumbar impingement. Small umbilical hernia contains adipose tissue. At that time, GI recommended that the patient follow-up with his surgeon to discuss possibly a biopsy of this mass in the mesentery.  However due to scheduling issues, the patient never saw the surgeon.  He decided not to go back.  He states that he is feeling better and his abdominal pain has improved somewhat.  However there has been no follow-up.  He denies any nausea or vomiting.  He denies any melena.  He occasionally has blood in his stool that he attributes to hemorrhoids.  Past Medical History:  Diagnosis Date   Colon cancer ([place]) 2019   in polyp removed during colonoscopy 03/2018, planned sigmoidectomy   GERD (gastroesophageal reflux disease)    occasional   Hypertension     Seizures ([place])    childhood-none since age of 50 y.o.   Past Surgical History:  Procedure Laterality Date   COLON SURGERY  05/20/2018   18 inches removed    COLONOSCOPY     TONSILLECTOMY     Current Outpatient Medications on File Prior to Visit  Medication Sig Dispense Refill   Ca Carbonate-Mag Hydroxide (ROLAIDS PO) Take by mouth as needed.     famotidine (PEPCID) 20 MG tablet Take 20 mg by mouth daily.     Simethicone (GAS-X PO) Take by mouth daily as needed.     No current facility-administered medications on file prior to visit.   No Known Allergies Social History   Socioeconomic History   Marital status: Married    Spouse name: Not on file   Number of children: 2   Years of education: Not on file   Highest education level: Not on file  Occupational History   Occupation: Autobody Office manager  Tobacco Use   Smoking status: Former    Types: Cigarettes    Quit date: 2007    Years since quitting: 15.8   Smokeless tobacco: Never   Tobacco comments:    quit 2005  Vaping Use   Vaping Use: Never used  Substance and Sexual Activity   Alcohol use: Yes    Comment: occasional   Drug use: No   Sexual activity: Yes    Partners: Female  Other Topics Concern   Not on file  Social History Narrative   Not on file   Social Determinants of Health   Financial Resource Strain: Not on file  Food Insecurity:  Not on file  Transportation Needs: Not on file  Physical Activity: Not on file  Stress: Not on file  Social Connections: Not on file  Intimate Partner Violence: Not on file   Family History  Problem Relation Age of Onset   Lung cancer Mother    Other Father        neck tumor   Colon polyps Father    Esophageal cancer Paternal Uncle    Breast cancer Paternal Aunt    Prostate cancer Paternal Uncle    Diabetes Paternal Uncle    Stroke Paternal Uncle    Rectal cancer Neg Hx    Colitis Neg Hx    Colon cancer Neg Hx    Stomach cancer Neg Hx   ' He does have a cousin  who was recently diagnosed with prostate cancer. Review of Systems  Gastrointestinal:  Positive for anal bleeding and blood in stool.  All other systems reviewed and are negative.     Objective:   Physical Exam Vitals reviewed.  Constitutional:      General: He is not in acute distress.    Appearance: He is well-developed. He is not diaphoretic.  HENT:     Head: Normocephalic and atraumatic.     Right Ear: External ear normal.     Left Ear: External ear normal.     Nose: Nose normal.     Mouth/Throat:     Pharynx: No oropharyngeal exudate.  Eyes:     General: No scleral icterus.       Right eye: No discharge.        Left eye: No discharge.     Conjunctiva/sclera: Conjunctivae normal.     Pupils: Pupils are equal, round, and reactive to light.  Neck:     Thyroid: No thyromegaly.     Vascular: No JVD.     Trachea: No tracheal deviation.  Cardiovascular:     Rate and Rhythm: Normal rate and regular rhythm.     Heart sounds: Normal heart sounds. No murmur heard.   No friction rub. No gallop.  Pulmonary:     Effort: Pulmonary effort is normal. No respiratory distress.     Breath sounds: Normal breath sounds. No stridor. No wheezing or rales.  Chest:     Chest wall: No tenderness.  Abdominal:     General: Bowel sounds are normal. There is no distension.     Palpations: Abdomen is soft. There is no mass.     Tenderness: There is no abdominal tenderness. There is no guarding.  Musculoskeletal:        General: No tenderness or deformity. Normal range of motion.     Cervical back: Normal range of motion and neck supple.  Lymphadenopathy:     Cervical: No cervical adenopathy.  Skin:    General: Skin is warm.     Coloration: Skin is not pale.     Findings: No erythema or rash.  Neurological:     Mental Status: He is alert and oriented to person, place, and time.     Cranial Nerves: No cranial nerve deficit.     Sensory: No sensory deficit.     Motor: No abnormal muscle  tone.     Coordination: Coordination normal.     Deep Tendon Reflexes: Reflexes normal.          Assessment & Plan:  Hypertension, unspecified type - Plan: COMPLETE METABOLIC PANEL WITH GFR  Obesity with serious comorbidity, unspecified classification, unspecified obesity type -  Plan: COMPLETE METABOLIC PANEL WITH GFR  Malignant neoplasm of colon, unspecified part of colon ([place])  Mesenteric mass I am most concerned about the mesenteric mass.  I recommended that we repeat a CAT scan.  Has been essentially 5 months since his last imaging.  If the lesion is growing, I feel that that would warrant a biopsy.  If it is resolving or has resolved, no further work-up is necessary.  Therefore we will start with a CT scan of the abdomen and pelvis to recheck the lesion in the mesentery.

## 2021-04-16 LAB — COMPLETE METABOLIC PANEL WITH GFR
AG Ratio: 2.2 (calc) (ref 1.0–2.5)
ALT: 38 U/L (ref 9–46)
AST: 25 U/L (ref 10–35)
Albumin: 4.4 g/dL (ref 3.6–5.1)
Alkaline phosphatase (APISO): 47 U/L (ref 35–144)
BUN: 14 mg/dL (ref 7–25)
CO2: 29 mmol/L (ref 20–32)
Calcium: 9.7 mg/dL (ref 8.6–10.3)
Chloride: 105 mmol/L (ref 98–110)
Creat: 0.94 mg/dL (ref 0.70–1.30)
Globulin: 2 g/dL (calc) (ref 1.9–3.7)
Glucose, Bld: 89 mg/dL (ref 65–99)
Potassium: 4.7 mmol/L (ref 3.5–5.3)
Sodium: 142 mmol/L (ref 135–146)
Total Bilirubin: 0.4 mg/dL (ref 0.2–1.2)
Total Protein: 6.4 g/dL (ref 6.1–8.1)
eGFR: 98 mL/min/{1.73_m2} (ref >=60)

## 2021-05-12 ENCOUNTER — Other Ambulatory Visit: Payer: BC Managed Care – PPO

## 2021-05-12 ENCOUNTER — Ambulatory Visit
Admission: RE | Admit: 2021-05-12 | Discharge: 2021-05-12 | Disposition: A | Discharge status: Home / Self Care | Payer: BC Managed Care – PPO | Source: Ambulatory Visit | Attending: Family Medicine | Admitting: Family Medicine

## 2021-05-12 DIAGNOSIS — K6389 Other specified diseases of intestine: Secondary | ICD-10-CM

## 2021-05-12 MED ORDER — IOPAMIDOL (ISOVUE-300) INJECTION 61%
100.0000 mL | Freq: Once | INTRAVENOUS | Status: AC | PRN
  Administered 2021-05-12: 100 mL via INTRAVENOUS

## 2021-05-14 ENCOUNTER — Other Ambulatory Visit: Payer: Self-pay

## 2021-05-14 DIAGNOSIS — K6389 Other specified diseases of intestine: Secondary | ICD-10-CM

## 2021-06-16 ENCOUNTER — Emergency Department (HOSPITAL_COMMUNITY): Payer: BC Managed Care – PPO | Admitting: Certified Registered"

## 2021-06-16 ENCOUNTER — Other Ambulatory Visit: Payer: Self-pay

## 2021-06-16 ENCOUNTER — Encounter (HOSPITAL_BASED_OUTPATIENT_CLINIC_OR_DEPARTMENT_OTHER): Payer: Self-pay | Admitting: Emergency Medicine

## 2021-06-16 ENCOUNTER — Encounter (HOSPITAL_COMMUNITY)
Admission: EM | Disposition: A | Discharge status: Home / Self Care | Payer: Self-pay | Source: Home / Self Care | Attending: Emergency Medicine

## 2021-06-16 ENCOUNTER — Other Ambulatory Visit (HOSPITAL_COMMUNITY): Payer: Self-pay

## 2021-06-16 ENCOUNTER — Emergency Department (HOSPITAL_BASED_OUTPATIENT_CLINIC_OR_DEPARTMENT_OTHER): Payer: BC Managed Care – PPO

## 2021-06-16 ENCOUNTER — Telehealth: Payer: Self-pay

## 2021-06-16 ENCOUNTER — Ambulatory Visit (HOSPITAL_BASED_OUTPATIENT_CLINIC_OR_DEPARTMENT_OTHER)
Admission: EM | Admit: 2021-06-16 | Discharge: 2021-06-16 | Disposition: A | Discharge status: Home / Self Care | Payer: BC Managed Care – PPO | Attending: Emergency Medicine | Admitting: Emergency Medicine

## 2021-06-16 DIAGNOSIS — Z8719 Personal history of other diseases of the digestive system: Secondary | ICD-10-CM | POA: Insufficient documentation

## 2021-06-16 DIAGNOSIS — Z20822 Contact with and (suspected) exposure to covid-19: Secondary | ICD-10-CM | POA: Insufficient documentation

## 2021-06-16 DIAGNOSIS — K819 Cholecystitis, unspecified: Secondary | ICD-10-CM

## 2021-06-16 DIAGNOSIS — Z85038 Personal history of other malignant neoplasm of large intestine: Secondary | ICD-10-CM | POA: Insufficient documentation

## 2021-06-16 DIAGNOSIS — K760 Fatty (change of) liver, not elsewhere classified: Secondary | ICD-10-CM | POA: Insufficient documentation

## 2021-06-16 DIAGNOSIS — Z9049 Acquired absence of other specified parts of digestive tract: Secondary | ICD-10-CM | POA: Diagnosis not present

## 2021-06-16 DIAGNOSIS — Z87891 Personal history of nicotine dependence: Secondary | ICD-10-CM | POA: Diagnosis not present

## 2021-06-16 DIAGNOSIS — E669 Obesity, unspecified: Secondary | ICD-10-CM | POA: Diagnosis not present

## 2021-06-16 DIAGNOSIS — K8012 Calculus of gallbladder with acute and chronic cholecystitis without obstruction: Secondary | ICD-10-CM | POA: Insufficient documentation

## 2021-06-16 HISTORY — PX: CHOLECYSTECTOMY: SHX55

## 2021-06-16 LAB — COMPREHENSIVE METABOLIC PANEL
ALT: 41 U/L (ref 0–44)
AST: 22 U/L (ref 15–41)
Albumin: 4.5 g/dL (ref 3.5–5.0)
Alkaline Phosphatase: 41 U/L (ref 38–126)
Anion gap: 12 (ref 5–15)
BUN: 19 mg/dL (ref 6–20)
CO2: 27 mmol/L (ref 22–32)
Calcium: 10.4 mg/dL — ABNORMAL HIGH (ref 8.9–10.3)
Chloride: 101 mmol/L (ref 98–111)
Creatinine, Ser: 1.1 mg/dL (ref 0.61–1.24)
GFR, Estimated: >60 mL/min (ref >60)
Glucose, Bld: 190 mg/dL — ABNORMAL HIGH (ref 70–99)
Potassium: 4 mmol/L (ref 3.5–5.1)
Sodium: 140 mmol/L (ref 135–145)
Total Bilirubin: 0.4 mg/dL (ref 0.3–1.2)
Total Protein: 7.4 g/dL (ref 6.5–8.1)

## 2021-06-16 LAB — CBC WITH DIFFERENTIAL/PLATELET
Abs Immature Granulocytes: 0.1 10*3/uL — ABNORMAL HIGH (ref 0.00–0.07)
Basophils Absolute: 0 10*3/uL (ref 0.0–0.1)
Basophils Relative: 0 %
Eosinophils Absolute: 0 10*3/uL (ref 0.0–0.5)
Eosinophils Relative: 0 %
HCT: 46.9 % (ref 39.0–52.0)
Hemoglobin: 16.3 g/dL (ref 13.0–17.0)
Immature Granulocytes: 1 %
Lymphocytes Relative: 9 %
Lymphs Abs: 1.2 10*3/uL (ref 0.7–4.0)
MCH: 29 pg (ref 26.0–34.0)
MCHC: 34.8 g/dL (ref 30.0–36.0)
MCV: 83.3 fL (ref 80.0–100.0)
Monocytes Absolute: 0.3 10*3/uL (ref 0.1–1.0)
Monocytes Relative: 3 %
Neutro Abs: 10.7 10*3/uL — ABNORMAL HIGH (ref 1.7–7.7)
Neutrophils Relative %: 87 %
Platelets: 282 10*3/uL (ref 150–400)
RBC: 5.63 MIL/uL (ref 4.22–5.81)
RDW: 13.1 % (ref 11.5–15.5)
WBC: 12.3 10*3/uL — ABNORMAL HIGH (ref 4.0–10.5)
nRBC: 0 % (ref 0.0–0.2)

## 2021-06-16 LAB — RESP PANEL BY RT-PCR (FLU A&B, COVID) ARPGX2
Influenza A by PCR: NEGATIVE
Influenza B by PCR: NEGATIVE
SARS Coronavirus 2 by RT PCR: NEGATIVE

## 2021-06-16 LAB — LIPASE, BLOOD: Lipase: 29 U/L (ref 11–51)

## 2021-06-16 SURGERY — LAPAROSCOPIC CHOLECYSTECTOMY
Anesthesia: General | Site: Abdomen

## 2021-06-16 MED ORDER — ACETAMINOPHEN 10 MG/ML IV SOLN
INTRAVENOUS | Status: AC
  Filled 2021-06-16: qty 100

## 2021-06-16 MED ORDER — LIDOCAINE 2% (20 MG/ML) 5 ML SYRINGE
INTRAMUSCULAR | Status: DC | PRN
  Administered 2021-06-16: 100 mg via INTRAVENOUS

## 2021-06-16 MED ORDER — FENTANYL CITRATE (PF) 250 MCG/5ML IJ SOLN
INTRAMUSCULAR | Status: AC
  Filled 2021-06-16: qty 5

## 2021-06-16 MED ORDER — 0.9 % SODIUM CHLORIDE (POUR BTL) OPTIME
TOPICAL | Status: DC | PRN
Start: 2021-06-16 — End: 2021-06-16
  Administered 2021-06-16: 1000 mL

## 2021-06-16 MED ORDER — MORPHINE SULFATE (PF) 4 MG/ML IV SOLN
4.0000 mg | Freq: Once | INTRAVENOUS | Status: AC
  Administered 2021-06-16: 4 mg via INTRAVENOUS
  Filled 2021-06-16: qty 1

## 2021-06-16 MED ORDER — LACTATED RINGERS IV SOLN: INTRAVENOUS | Status: DC

## 2021-06-16 MED ORDER — CHLORHEXIDINE GLUCONATE 0.12 % MT SOLN
15.0000 mL | Freq: Once | OROMUCOSAL | Status: AC
  Administered 2021-06-16: 15 mL via OROMUCOSAL
  Filled 2021-06-16: qty 15

## 2021-06-16 MED ORDER — CEFTRIAXONE SODIUM 1 G IJ SOLR
1.0000 g | Freq: Once | INTRAMUSCULAR | Status: AC
  Administered 2021-06-16: 1 g via INTRAVENOUS
  Filled 2021-06-16: qty 10

## 2021-06-16 MED ORDER — ACETAMINOPHEN 10 MG/ML IV SOLN
INTRAVENOUS | Status: DC | PRN
  Administered 2021-06-16: 1000 mg via INTRAVENOUS

## 2021-06-16 MED ORDER — SODIUM CHLORIDE 0.9 % IR SOLN
Status: DC | PRN
Start: 2021-06-16 — End: 2021-06-16
  Administered 2021-06-16: 1000 mL

## 2021-06-16 MED ORDER — DEXAMETHASONE SODIUM PHOSPHATE 10 MG/ML IJ SOLN
INTRAMUSCULAR | Status: DC | PRN
  Administered 2021-06-16: 10 mg via INTRAVENOUS

## 2021-06-16 MED ORDER — MIDAZOLAM HCL 2 MG/2ML IJ SOLN
INTRAMUSCULAR | Status: AC
  Filled 2021-06-16: qty 2

## 2021-06-16 MED ORDER — ONDANSETRON HCL 4 MG/2ML IJ SOLN
4.0000 mg | Freq: Once | INTRAMUSCULAR | Status: AC
  Administered 2021-06-16: 4 mg via INTRAVENOUS
  Filled 2021-06-16: qty 2

## 2021-06-16 MED ORDER — ROCURONIUM BROMIDE 10 MG/ML (PF) SYRINGE
PREFILLED_SYRINGE | INTRAVENOUS | Status: DC | PRN
  Administered 2021-06-16: 50 mg via INTRAVENOUS

## 2021-06-16 MED ORDER — IOHEXOL 300 MG/ML  SOLN
100.0000 mL | Freq: Once | INTRAMUSCULAR | Status: AC | PRN
  Administered 2021-06-16: 100 mL via INTRAVENOUS

## 2021-06-16 MED ORDER — MIDAZOLAM HCL 5 MG/5ML IJ SOLN
INTRAMUSCULAR | Status: DC | PRN
Start: 2021-06-16 — End: 2021-06-16
  Administered 2021-06-16: 2 mg via INTRAVENOUS

## 2021-06-16 MED ORDER — FENTANYL CITRATE (PF) 250 MCG/5ML IJ SOLN
INTRAMUSCULAR | Status: DC | PRN
Start: 2021-06-16 — End: 2021-06-16
  Administered 2021-06-16 (×2): 50 ug via INTRAVENOUS
  Administered 2021-06-16: 150 ug via INTRAVENOUS

## 2021-06-16 MED ORDER — BUPIVACAINE-EPINEPHRINE (PF) 0.25% -1:200000 IJ SOLN
INTRAMUSCULAR | Status: AC
  Filled 2021-06-16: qty 30

## 2021-06-16 MED ORDER — INDOCYANINE GREEN 25 MG IV SOLR
INTRAVENOUS | Status: DC | PRN
  Administered 2021-06-16: 2.5 mg via INTRAVENOUS

## 2021-06-16 MED ORDER — PROPOFOL 10 MG/ML IV BOLUS
INTRAVENOUS | Status: AC
  Filled 2021-06-16: qty 20

## 2021-06-16 MED ORDER — ONDANSETRON HCL 4 MG/2ML IJ SOLN
INTRAMUSCULAR | Status: DC | PRN
  Administered 2021-06-16: 4 mg via INTRAVENOUS

## 2021-06-16 MED ORDER — OXYCODONE HCL 5 MG PO TABS
5.0000 mg | ORAL_TABLET | Freq: Four times a day (QID) | ORAL | 0 refills | Status: DC | PRN
  Filled 2021-06-16: qty 15, 4d supply, fill #0

## 2021-06-16 MED ORDER — ORAL CARE MOUTH RINSE: 15.0000 mL | Freq: Once | OROMUCOSAL | Status: AC

## 2021-06-16 MED ORDER — SODIUM CHLORIDE 0.9 % IV BOLUS
1000.0000 mL | Freq: Once | INTRAVENOUS | Status: AC
  Administered 2021-06-16: 1000 mL via INTRAVENOUS

## 2021-06-16 MED ORDER — SUGAMMADEX SODIUM 200 MG/2ML IV SOLN
INTRAVENOUS | Status: DC | PRN
  Administered 2021-06-16: 300 mg via INTRAVENOUS

## 2021-06-16 MED ORDER — PROPOFOL 10 MG/ML IV BOLUS
INTRAVENOUS | Status: DC | PRN
  Administered 2021-06-16: 180 mg via INTRAVENOUS

## 2021-06-16 MED ORDER — BUPIVACAINE-EPINEPHRINE 0.25% -1:200000 IJ SOLN
INTRAMUSCULAR | Status: DC | PRN
Start: 2021-06-16 — End: 2021-06-16
  Administered 2021-06-16: 5 mL

## 2021-06-16 MED ORDER — SUCCINYLCHOLINE CHLORIDE 200 MG/10ML IV SOSY
PREFILLED_SYRINGE | INTRAVENOUS | Status: DC | PRN
  Administered 2021-06-16: 120 mg via INTRAVENOUS

## 2021-06-16 SURGICAL SUPPLY — 41 items
BAG COUNTER SPONGE SURGICOUNT (BAG) ×2 IMPLANT
CANISTER SUCT 3000ML PPV (MISCELLANEOUS) ×2 IMPLANT
CHLORAPREP W/TINT 26 (MISCELLANEOUS) ×2 IMPLANT
CLIP LIGATING HEMO O LOK GREEN (MISCELLANEOUS) ×3 IMPLANT
COVER SURGICAL LIGHT HANDLE (MISCELLANEOUS) ×2 IMPLANT
COVER TRANSDUCER ULTRASND (DRAPES) ×2 IMPLANT
DERMABOND ADVANCED (GAUZE/BANDAGES/DRESSINGS) ×1
DERMABOND ADVANCED .7 DNX12 (GAUZE/BANDAGES/DRESSINGS) ×1 IMPLANT
ELECT REM PT RETURN 9FT ADLT (ELECTROSURGICAL) ×2
ELECTRODE REM PT RTRN 9FT ADLT (ELECTROSURGICAL) ×1 IMPLANT
GLOVE SURG SYN 7.5  E (GLOVE) ×1
GLOVE SURG SYN 7.5 E (GLOVE) ×1 IMPLANT
GLOVE SURG SYN 7.5 PF PI (GLOVE) ×1 IMPLANT
GOWN STRL REUS W/ TWL LRG LVL3 (GOWN DISPOSABLE) ×2 IMPLANT
GOWN STRL REUS W/ TWL XL LVL3 (GOWN DISPOSABLE) ×1 IMPLANT
GOWN STRL REUS W/TWL LRG LVL3 (GOWN DISPOSABLE) ×2
GOWN STRL REUS W/TWL XL LVL3 (GOWN DISPOSABLE) ×1
GRASPER SUT TROCAR 14GX15 (MISCELLANEOUS) ×2 IMPLANT
KIT BASIN OR (CUSTOM PROCEDURE TRAY) ×2 IMPLANT
KIT TURNOVER KIT B (KITS) ×2 IMPLANT
NDL INSUFFLATION 14GA 120MM (NEEDLE) ×1 IMPLANT
NEEDLE INSUFFLATION 14GA 120MM (NEEDLE) ×2 IMPLANT
NS IRRIG 1000ML POUR BTL (IV SOLUTION) ×2 IMPLANT
PAD ARMBOARD 7.5X6 YLW CONV (MISCELLANEOUS) ×2 IMPLANT
POUCH LAPAROSCOPIC INSTRUMENT (MISCELLANEOUS) ×2 IMPLANT
POUCH RETRIEVAL ECOSAC 10 (ENDOMECHANICALS) IMPLANT
POUCH RETRIEVAL ECOSAC 10MM (ENDOMECHANICALS)
SCISSORS LAP 5X35 DISP (ENDOMECHANICALS) ×2 IMPLANT
SET IRRIG TUBING LAPAROSCOPIC (IRRIGATION / IRRIGATOR) ×2 IMPLANT
SET TUBE SMOKE EVAC HIGH FLOW (TUBING) ×2 IMPLANT
SLEEVE ENDOPATH XCEL 5M (ENDOMECHANICALS) ×3 IMPLANT
SPECIMEN JAR SMALL (MISCELLANEOUS) ×2 IMPLANT
SUT MNCRL AB 4-0 PS2 18 (SUTURE) ×2 IMPLANT
SUT VICRYL 0 UR6 27IN ABS (SUTURE) ×1 IMPLANT
TOWEL GREEN STERILE (TOWEL DISPOSABLE) ×2 IMPLANT
TOWEL GREEN STERILE FF (TOWEL DISPOSABLE) ×2 IMPLANT
TRAY LAPAROSCOPIC MC (CUSTOM PROCEDURE TRAY) ×2 IMPLANT
TROCAR XCEL NON-BLD 11X100MML (ENDOMECHANICALS) ×2 IMPLANT
TROCAR XCEL NON-BLD 5MMX100MML (ENDOMECHANICALS) ×2 IMPLANT
WARMER LAPAROSCOPE (MISCELLANEOUS) ×2 IMPLANT
WATER STERILE IRR 1000ML POUR (IV SOLUTION) ×1 IMPLANT

## 2021-06-16 NOTE — Discharge Instructions (Signed)
[facility] ______[facility] °LAPAROSCOPIC SURGERY: POST OP INSTRUCTIONS °Always review your discharge instruction sheet given to you by the facility where your surgery was performed. °IF YOU HAVE DISABILITY OR FAMILY LEAVE FORMS, YOU MUST BRING THEM TO THE OFFICE FOR PROCESSING.   °DO NOT GIVE THEM TO YOUR DOCTOR. ° °A prescription for pain medication may be given to you upon discharge.  Take your pain medication as prescribed, if needed.  If narcotic pain medicine is not needed, then you may take acetaminophen (Tylenol) or ibuprofen (Advil) as needed. °Take your usually prescribed medications unless otherwise directed. °If you need a refill on your pain medication, please contact your pharmacy.  They will contact our office to request authorization. Prescriptions will not be filled after 5pm or on week-ends. °You should follow a light diet the first few days after arrival home, such as soup and crackers, etc.  Be sure to include lots of fluids daily. °Most patients will experience some swelling and bruising in the area of the incisions.  Ice packs will help.  Swelling and bruising can take several days to resolve.  °It is common to experience some constipation if taking pain medication after surgery.  Increasing fluid intake and taking a stool softener (such as Colace) will usually help or prevent this problem from occurring.  A mild laxative (Milk of Magnesia or Miralax) should be taken according to package instructions if there are no bowel movements after 48 hours. °Unless discharge instructions indicate otherwise, you may remove your bandages 24-48 hours after surgery, and you may shower at that time.  You may have steri-strips (small skin tapes) in place directly over the incision.  These strips should be left on the skin for 7-10 days.  If your surgeon used skin glue on the incision, you may shower in 24 hours.  The glue will flake off over the next 2-3 weeks.  Any sutures or staples will be  removed at the office during your follow-up visit. °ACTIVITIES:  You may resume regular (light) daily activities beginning the next day--such as daily self-care, walking, climbing stairs--gradually increasing activities as tolerated.  You may have sexual intercourse when it is comfortable.  Refrain from any heavy lifting or straining until approved by your doctor. °You may drive when you are no longer taking prescription pain medication, you can comfortably wear a seatbelt, and you can safely maneuver your car and apply brakes. °RETURN TO WORK:  __________________________________________________________ °You should see your doctor in the office for a follow-up appointment approximately 2-3 weeks after your surgery.  Make sure that you call for this appointment within a day or two after you arrive home to insure a convenient appointment time. °OTHER INSTRUCTIONS: __________________________________________________________________________________________________________________________ __________________________________________________________________________________________________________________________ °WHEN TO CALL YOUR DOCTOR: °Fever over 101.0 °Inability to urinate °Continued bleeding from incision. °Increased pain, redness, or drainage from the incision. °Increasing abdominal pain ° °The clinic staff is available to answer your questions during regular business hours.  Please don’t hesitate to call and ask to speak to one of the nurses for clinical concerns.  If you have a medical emergency, go to the nearest emergency room or call 911.  A surgeon from [facility] is always on call at the hospital. °[address], [address], [place], [place]  [address] ? [address] [address], [place], [place]   [address] °[phone] ? [phone] ? FAX [phone] °Web site: [email or web address] ° ° ° °Managing Your Pain After Surgery Without Opioids ° ° ° °Thank you for   participating in our program to  help patients manage their pain after surgery without opioids. This is part of our effort to provide you with the best care possible, without exposing you or your family to the risk that opioids pose. ° °What pain can I expect after surgery? °You can expect to have some pain after surgery. This is normal. The pain is typically worse the day after surgery, and quickly begins to get better. °Many studies have found that many patients are able to manage their pain after surgery with Over-the-Counter (OTC) medications such as Tylenol and Motrin. If you have a condition that does not allow you to take Tylenol or Motrin, notify your surgical team. ° °How will I manage my pain? °The best strategy for controlling your pain after surgery is around the clock pain control with Tylenol (acetaminophen) and Motrin (ibuprofen or Advil). Alternating these medications with each other allows you to maximize your pain control. In addition to Tylenol and Motrin, you can use heating pads or ice packs on your incisions to help reduce your pain. ° °How will I alternate your regular strength over-the-counter pain medication? °You will take a dose of pain medication every three hours. °Start by taking 650 mg of Tylenol (2 pills of 325 mg) °3 hours later take 600 mg of Motrin (3 pills of 200 mg) °3 hours after taking the Motrin take 650 mg of Tylenol °3 hours after that take 600 mg of Motrin. ° ° °- 1 - ° °See example - if your first dose of Tylenol is at 12:00 PM ° ° °12:00 PM Tylenol 650 mg (2 pills of 325 mg)  °3:00 PM Motrin 600 mg (3 pills of 200 mg)  °6:00 PM Tylenol 650 mg (2 pills of 325 mg)  °9:00 PM Motrin 600 mg (3 pills of 200 mg)  °Continue alternating every 3 hours  ° °We recommend that you follow this schedule around-the-clock for at least 3 days after surgery, or until you feel that it is no longer needed. Use the table on the last page of this handout to keep track of the medications you are taking. °Important: °Do not take  more than 3000mg of Tylenol or 3200mg of Motrin in a 24-hour period. °Do not take ibuprofen/Motrin if you have a history of bleeding stomach ulcers, severe kidney disease, &/or actively taking a blood thinner ° °What if I still have pain? °If you have pain that is not controlled with the over-the-counter pain medications (Tylenol and Motrin or Advil) you might have what we call “breakthrough” pain. You will receive a prescription for a small amount of an opioid pain medication such as Oxycodone, Tramadol, or Tylenol with Codeine. Use these opioid pills in the first 24 hours after surgery if you have breakthrough pain. Do not take more than 1 pill every 4-6 hours. ° °If you still have uncontrolled pain after using all opioid pills, don't hesitate to call our staff using the number provided. We will help make sure you are managing your pain in the best way possible, and if necessary, we can provide a prescription for additional pain medication. ° ° °Day 1   ° °Time  °Name of Medication Number of pills taken  °Amount of Acetaminophen  °Pain Level  ° °Comments  °AM PM       °AM PM       °AM PM       °AM PM       °AM PM       °  AM PM       °AM PM       °AM PM       °Total Daily amount of Acetaminophen °Do not take more than  3,000 mg per day    ° ° °Day 2   ° °Time  °Name of Medication Number of pills °taken  °Amount of Acetaminophen  °Pain Level  ° °Comments  °AM PM       °AM PM       °AM PM       °AM PM       °AM PM       °AM PM       °AM PM       °AM PM       °Total Daily amount of Acetaminophen °Do not take more than  3,000 mg per day    ° ° °Day 3   ° °Time  °Name of Medication Number of pills taken  °Amount of Acetaminophen  °Pain Level  ° °Comments  °AM PM       °AM PM       °AM PM       °AM PM       ° ° ° °AM PM       °AM PM       °AM PM       °AM PM       °Total Daily amount of Acetaminophen °Do not take more than  3,000 mg per day    ° ° °Day 4   ° °Time  °Name of Medication Number of pills taken  °Amount of  Acetaminophen  °Pain Level  ° °Comments  °AM PM       °AM PM       °AM PM       °AM PM       °AM PM       °AM PM       °AM PM       °AM PM       °Total Daily amount of Acetaminophen °Do not take more than  3,000 mg per day    ° ° °Day 5   ° °Time  °Name of Medication Number °of pills taken  °Amount of Acetaminophen  °Pain Level  ° °Comments  °AM PM       °AM PM       °AM PM       °AM PM       °AM PM       °AM PM       °AM PM       °AM PM       °Total Daily amount of Acetaminophen °Do not take more than  3,000 mg per day    ° ° ° °Day 6   ° °Time  °Name of Medication Number of pills °taken  °Amount of Acetaminophen  °Pain Level  °Comments  °AM PM       °AM PM       °AM PM       °AM PM       °AM PM       °AM PM       °AM PM       °AM PM       °Total Daily amount of Acetaminophen °Do not take more than  3,000 mg per day    ° ° °Day 7   ° °Time  °  Name of Medication Number of pills taken  °Amount of Acetaminophen  °Pain Level  ° °Comments  °AM PM       °AM PM       °AM PM       °AM PM       °AM PM       °AM PM       °AM PM       °AM PM       °Total Daily amount of Acetaminophen °Do not take more than  3,000 mg per day    ° ° ° ° °For additional information about how and where to safely dispose of unused opioid °medications - https://www.[facility]ful[employer].org ° °Disclaimer: This document contains information and/or instructional materials adapted from [facility] for the typical patient with your condition. It does not replace medical advice from your health care provider because your experience may differ from that of the °typical patient. Talk to your health care provider if you have any questions about this °document, your condition or your treatment plan. °Adapted from [facility] ° °

## 2021-06-16 NOTE — Anesthesia Preprocedure Evaluation (Signed)
Anesthesia Evaluation  Patient identified by MRN, date of birth, ID band Patient awake    Reviewed: Allergy & Precautions, NPO status , Patient's Chart, lab work & pertinent test results  Airway Mallampati: III  TM Distance: >3 FB Neck ROM: Full    Dental  (+) Teeth Intact, Dental Advisory Given   Pulmonary former smoker,    Pulmonary exam normal breath sounds clear to auscultation       Cardiovascular hypertension, Pt. 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    Endo/Other  Morbid obesityBMI 42  Renal/GU negative Renal ROS  negative genitourinary   Musculoskeletal negative musculoskeletal ROS (+)   Abdominal (+) + obese,   Peds negative pediatric ROS (+)  Hematology negative hematology ROS (+) hct 46.9   Anesthesia Other Findings   Reproductive/Obstetrics negative OB ROS                             Anesthesia Physical Anesthesia Plan  ASA: 3  Anesthesia Plan: General   Post-op Pain Management:    Induction: Intravenous  PONV Risk Score and Plan: 3 and Ondansetron, Dexamethasone, Midazolam and Treatment may vary due to age or medical condition  Airway Management Planned: Oral ETT  Additional Equipment: None  Intra-op Plan:   Post-operative Plan: Extubation in OR  Informed Consent: I have reviewed the patients History and Physical, chart, labs and discussed the procedure including the risks, benefits and alternatives for the proposed anesthesia with the patient or authorized representative who has indicated his/her understanding and acceptance.     Dental advisory given  Plan Discussed with: CRNA  Anesthesia Plan Comments:         Anesthesia Quick  Evaluation

## 2021-06-16 NOTE — Anesthesia Procedure Notes (Signed)
Procedure Name: Intubation Date/Time: 06/16/2021 10:31 AM Performed by: Griffin Dakin, CRNA Pre-anesthesia Checklist: Patient identified, Emergency Drugs available, Suction available and Patient being monitored Patient Re-evaluated:Patient Re-evaluated prior to induction Oxygen Delivery Method: Circle system utilized Preoxygenation: Pre-oxygenation with 100% oxygen Induction Type: IV induction and Rapid sequence Laryngoscope Size: Mac and 4 Grade View: Grade II Tube type: Oral Number of attempts: 1 Airway Equipment and Method: Stylet and Oral airway Placement Confirmation: ETT inserted through vocal cords under direct vision, positive ETCO2 and breath sounds checked- equal and bilateral Secured at: 24 cm Tube secured with: Tape Dental Injury: Teeth and Oropharynx as per pre-operative assessment

## 2021-06-16 NOTE — Op Note (Signed)
06/16/2021  11:18 AM  PATIENT:  Russell Novak [facility]  51 y.o. male  PRE-OPERATIVE DIAGNOSIS:  Cholecystitis  POST-OPERATIVE DIAGNOSIS:  Cholecystitis  PROCEDURE:  Procedure(s): LAPAROSCOPIC CHOLECYSTECTOMY with ICG dye (N/A)  SURGEON:  Surgeon(s) and Role:    Ralene Ok, MD - Primary  ASSISTANTS: Ewell Poe, RNFA   ANESTHESIA:   local and general  EBL:  minimal   BLOOD ADMINISTERED:none  DRAINS: none   LOCAL MEDICATIONS USED:  BUPIVICAINE   SPECIMEN:  Source of Specimen:  gallbladder  DISPOSITION OF SPECIMEN:  PATHOLOGY  COUNTS:  YES  TOURNIQUET:  * No tourniquets in log *  DICTATION: .Dragon Dictation  The patient was taken to the operating and placed in the supine position with bilateral SCDs in place.  The patient was prepped and draped in the usual sterile fashion. A time out was called and all facts were verified. A pneumoperitoneum was obtained via A Veress needle technique to a pressure of 54mm of mercury.  A 75mm trochar was then placed in the right upper quadrant under visualization, and there were no injuries to any abdominal organs. A 11 mm port was then placed in the umbilical region after infiltrating with local anesthesia under direct visualization. A second and third epigastric port and right lower quadrant port placement under direct visualization, respectively.    The gallbladder was identified and retracted, the peritoneum was then sharply dissected from the gallbladder and this dissection was carried down to Calot's triangle. The cystic duct was identified and stripped away circumferentially and seen going into the gallbladder 360, the critical angle was obtained.  2 clips were placed proximally one distally and the cystic duct transected. The cystic artery was identified and 2 clips placed proximally and one distally and transected.  We then proceeded to remove the gallbladder off the hepatic fossa with Bovie cautery. A retrieval bag was then  placed in the abdomen and gallbladder placed in the bag. The hepatic fossa was then reexamined and hemostasis was achieved with Bovie cautery and was excellent at the end of the case.   The subhepatic fossa and perihepatic fossa was then irrigated until the effluent was clear.  The gallbladder and bag were removed from the abdominal cavity. The 11 mm trocar fascia was reapproximated with the Endo Close #1 Vicryl x3.  The pneumoperitoneum was evacuated and all trochars removed under direct visulalization.  The skin was then closed with 4-0 Monocryl and the skin dressed with Dermabond.    The patient was awaken from general anesthesia and taken to the recovery room in stable condition.   PLAN OF CARE: Discharge to home after PACU  PATIENT DISPOSITION:  PACU - hemodynamically stable.   Delay start of Pharmacological VTE agent (>24hrs) due to surgical blood loss or risk of bleeding: not applicable

## 2021-06-16 NOTE — H&P (Signed)
Russell Novak is an 51 y.o. male.   Chief Complaint: Abdominal pain HPI: Patient is a 51 year old male who comes in secondary to abdominal pain.  He states these began yesterday afternoon after lunch.  He states this with associated nausea vomiting.  He states the pain was in the epigastrium radiated down to the midline.  He states that he had not had previous pain like this in the past.  Patient underwent work-up in outside ER and was found to have a 1.9 cm gallstone lodged within the neck of his gallbladder.  Signs consistent with acute cholecystitis.  I did review the scans personally.  Patient had normal liver functions.  Patient was transferred to [facility] for further treatment.  Patient had a previous colon resection in 2019.  Past Medical History:  Diagnosis Date   Colon cancer ([place]) 2019   in polyp removed during colonoscopy 03/2018, planned sigmoidectomy   GERD (gastroesophageal reflux disease)    occasional   Hypertension    Seizures ([place])    childhood-none since age of 51 y.o.    Past Surgical History:  Procedure Laterality Date   COLON SURGERY  05/20/2018   18 inches removed    COLONOSCOPY     TONSILLECTOMY      Family History  Problem Relation Age of Onset   Lung cancer Mother    Other Father        neck tumor   Colon polyps Father    Esophageal cancer Paternal Uncle    Breast cancer Paternal Aunt    Prostate cancer Paternal Uncle    Diabetes Paternal Uncle    Stroke Paternal Uncle    Rectal cancer Neg Hx    Colitis Neg Hx    Colon cancer Neg Hx    Stomach cancer Neg Hx    Social History:  reports that he quit smoking about 16 years ago. His smoking use included cigarettes. He has never used smokeless tobacco. He reports current alcohol use. He reports that he does not use drugs.  Allergies: No Known Allergies  Medications Prior to Admission  Medication Sig Dispense Refill   famotidine (PEPCID) 20 MG tablet Take 20 mg by mouth daily.      hydrochlorothiazide (HYDRODIURIL) 12.5 MG tablet Take 1 tablet (12.5 mg total) by mouth daily. 90 tablet 3   telmisartan (MICARDIS) 40 MG tablet Take 1 tablet (40 mg total) by mouth daily. 90 tablet 3   Ca Carbonate-Mag Hydroxide (ROLAIDS PO) Take by mouth as needed.     Simethicone (GAS-X PO) Take by mouth daily as needed.      Results for orders placed or performed during the hospital encounter of 06/16/21 (from the past 48 hour(s))  CBC with Differential     Status: Abnormal   Collection Time: 06/16/21  1:29 AM  Result Value Ref Range   WBC 12.3 (H) 4.0 - 10.5 K/uL   RBC 5.63 4.22 - 5.81 MIL/uL   Hemoglobin 16.3 13.0 - 17.0 g/dL   HCT 46.9 39.0 - 52.0 %   MCV 83.3 80.0 - 100.0 fL   MCH 29.0 26.0 - 34.0 pg   MCHC 34.8 30.0 - 36.0 g/dL   RDW 13.1 11.5 - 15.5 %   Platelets 282 150 - 400 K/uL   nRBC 0.0 0.0 - 0.2 %   Neutrophils Relative % 87 %   Neutro Abs 10.7 (H) 1.7 - 7.7 K/uL   Lymphocytes Relative 9 %   Lymphs Abs 1.2 0.7 -  4.0 K/uL   Monocytes Relative 3 %   Monocytes Absolute 0.3 0.1 - 1.0 K/uL   Eosinophils Relative 0 %   Eosinophils Absolute 0.0 0.0 - 0.5 K/uL   Basophils Relative 0 %   Basophils Absolute 0.0 0.0 - 0.1 K/uL   Immature Granulocytes 1 %   Abs Immature Granulocytes 0.10 (H) 0.00 - 0.07 K/uL    Comment: Performed at [facility], [address] [place], [place] [address]  Comprehensive metabolic panel     Status: Abnormal   Collection Time: 06/16/21  1:29 AM  Result Value Ref Range   Sodium 140 135 - 145 mmol/L   Potassium 4.0 3.5 - 5.1 mmol/L   Chloride 101 98 - 111 mmol/L   CO2 27 22 - 32 mmol/L   Glucose, Bld 190 (H) 70 - 99 mg/dL    Comment: Glucose reference range applies only to samples taken after fasting for at least 8 hours.   BUN 19 6 - 20 mg/dL   Creatinine, Ser 1.10 0.61 - 1.24 mg/dL   Calcium 10.4 (H) 8.9 - 10.3 mg/dL   Total Protein 7.4 6.5 - 8.1 g/dL   Albumin 4.5 3.5 - 5.0 g/dL   AST 22 15 - 41 U/L   ALT 41  0 - 44 U/L   Alkaline Phosphatase 41 38 - 126 U/L   Total Bilirubin 0.4 0.3 - 1.2 mg/dL   GFR, Estimated >60 >60 mL/min    Comment: (NOTE) Calculated using the CKD-EPI Creatinine Equation (2021)    Anion gap 12 5 - 15    Comment: Performed at [facility], [address], [place], [place] [address]  Lipase, blood     Status: None   Collection Time: 06/16/21  1:29 AM  Result Value Ref Range   Lipase 29 11 - 51 U/L    Comment: Performed at [facility], [address] [place], [place] [address]  Resp Panel by RT-PCR (Flu A&B, Covid) Nasopharyngeal Swab     Status: None   Collection Time: 06/16/21  5:24 AM   Specimen: Nasopharyngeal Swab; Nasopharyngeal(NP) swabs in vial transport medium  Result Value Ref Range   SARS Coronavirus 2 by RT PCR NEGATIVE NEGATIVE    Comment: (NOTE) SARS-CoV-2 target nucleic acids are NOT DETECTED.  The SARS-CoV-2 RNA is generally detectable in upper respiratory specimens during the acute phase of infection. The lowest concentration of SARS-CoV-2 viral copies this assay can detect is 138 copies/mL. A negative result does not preclude SARS-Cov-2 infection and should not be used as the sole basis for treatment or other patient management decisions. A negative result may occur with  improper specimen collection/handling, submission of specimen other than nasopharyngeal swab, presence of viral mutation(s) within the areas targeted by this assay, and inadequate number of viral copies(<138 copies/mL). A negative result must be combined with clinical observations, patient history, and epidemiological information. The expected result is Negative.  Fact Sheet for Patients:  EntrepreneurPulse.com.au  Fact Sheet for Healthcare Providers:  IncredibleEmployment.be  This test is no t yet approved or cleared by the [place] FDA and  has been authorized for detection and/or diagnosis  of SARS-CoV-2 by FDA under an Emergency Use Authorization (EUA). This EUA will remain  in effect (meaning this test can be used) for the duration of the COVID-19 declaration under Section 564(b)(1) of the Act, 21 U.S.C.section 360bbb-3(b)(1), unless the authorization is terminated  or revoked sooner.       Influenza A by  PCR NEGATIVE NEGATIVE   Influenza B by PCR NEGATIVE NEGATIVE    Comment: (NOTE) The Xpert Xpress SARS-CoV-2/FLU/RSV plus assay is intended as an aid in the diagnosis of influenza from Nasopharyngeal swab specimens and should not be used as a sole basis for treatment. Nasal washings and aspirates are unacceptable for Xpert Xpress SARS-CoV-2/FLU/RSV testing.  Fact Sheet for Patients: EntrepreneurPulse.com.au  Fact Sheet for Healthcare Providers: IncredibleEmployment.be  This test is not yet approved or cleared by the [place] FDA and has been authorized for detection and/or diagnosis of SARS-CoV-2 by FDA under an Emergency Use Authorization (EUA). This EUA will remain in effect (meaning this test can be used) for the duration of the COVID-19 declaration under Section 564(b)(1) of the Act, 21 U.S.C. section 360bbb-3(b)(1), unless the authorization is terminated or revoked.  Performed at [facility], [address], [place], [place] [address]    CT ABDOMEN PELVIS W CONTRAST  Result Date: 06/16/2021 CLINICAL DATA:  Worsening upper abdominal pain. EXAM: CT ABDOMEN AND PELVIS WITH CONTRAST TECHNIQUE: Multidetector CT imaging of the abdomen and pelvis was performed using the standard protocol following bolus administration of intravenous contrast. RADIATION DOSE REDUCTION: This exam was performed according to the departmental dose-optimization program which includes automated exposure control, adjustment of the mA and/or kV according to patient size and/or use of iterative reconstruction technique. CONTRAST:   136mL OMNIPAQUE IOHEXOL 300 MG/ML  SOLN COMPARISON:  CT with contrast 05/12/2021 and 04/01/2018, CT without contrast 12/24/2020. FINDINGS: Lower chest: No acute abnormality. Hepatobiliary: 20 cm in length moderately steatotic liver. No mass enhancement. The gallbladder has become distended and there is at least borderline wall thickening. The gallbladder length is 11 cm. 1.9 cm stone appears to be lodged in the gallbladder neck and could be impacted. There is faint pericholecystic edema. There is no biliary dilatation. Pancreas: No mass or ductal dilatation.  Partial pancreatic atrophy. Spleen: No mass enhancement or splenomegaly. Adrenals/Urinary Tract: Stable 1.5 cm right adrenal adenoma. No left adrenal mass is seen. There are scattered bilateral renal cysts and additional too small to characterize subcentimeter hypodensities. 2.1 cm exophytic lesion of the left upper pole above the typical density of cysts, in 2019 was 1.5 cm. There are no stones or hydronephrosis. Stomach/Bowel: No dilatation or wall thickening including the appendix. There are small stones in the appendix. Colonic diverticula without diverticulitis. Patent sigmoid surgical colocolic anastomosis. Vascular/Lymphatic: Aortic atherosclerosis. No enlarged abdominal or pelvic lymph nodes. Reproductive: Normal prostate. 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 There is a small umbilical fat hernia. There is no free air, hemorrhage or fluid. Musculoskeletal: There is degenerative disc disease in the lower thoracic and lumbar region, spondylosis, lower thoracic bridging enthesopathy multiple levels, and lumbar facet hypertrophy. There is moderate spinal canal encroachment due to disc osteophyte complex or calcified herniated disc at L1-2. There is multilevel degenerative lumbar  foraminal stenosis. No worrisome regional bone lesion. IMPRESSION: 1. 2 cm stone in the gallbladder neck which may be impacted, with increased gallbladder dilatation and pericholecystic edema with slight gallbladder wall prominence. Findings worrisome for acute cholecystitis. 2. 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 Differential diagnosis includes postoperative changes, mesenteric infarction, fibrosing mesenteritis and neoplasm including carcinoid tumor. 3. Left renal lesion  in the upper pole above the density of fluid, larger than in 2019. Consider follow-up MRI renal protocol. 4. Additional chronic findings. Electronically Signed   By: Telford Nab M.D.   On: 06/16/2021 03:34   US Abdomen Limited RUQ (LIVER/GB)  Result Date: 06/16/2021 CLINICAL DATA:  Right upper quadrant pain with CT suggesting possible cholecystitis and an impacted stone in the gallbladder neck. EXAM: ULTRASOUND ABDOMEN LIMITED RIGHT UPPER QUADRANT COMPARISON:  CT with contrast earlier today. FINDINGS: Gallbladder: The patient has been medicated with morphine. Sonographic Murphy's sign cannot be assessed. There are similar findings to the CT. Multiple stones are noted, largest is impacted in the neck measuring 1.9 cm and there is free wall thickening up to 3.5 mm, small amount of intraluminal sludge and trace pericholecystic fluid. Findings suspicious for acute cholecystitis. The gallbladder is distended approaching 12 cm in length. Common bile duct: Diameter: 3.1 mm. Liver: No focal lesion identified. 20 cm in length liver is noted with moderate increased echogenicity of steatosis. Portal vein is patent on color Doppler imaging with normal direction of blood flow towards the liver. Other: None. IMPRESSION: 1. Distended gallbladder with 1.9 cm impacted stone in the neck, sludge, wall thickening and pericholecystic fluid.  Findings most likely are due to acute cholecystitis. Sonographic Murphy's sign unable to be evaluated due to patient medication with morphine. 2. Fatty liver. Electronically Signed   By: Telford Nab M.D.   On: 06/16/2021 04:22    Review of Systems  Constitutional: Negative.   HENT: Negative.    Eyes: Negative.   Respiratory: Negative.    Cardiovascular: Negative.   Gastrointestinal:  Positive for abdominal pain, nausea and vomiting.  Endocrine: Negative.   Neurological: Negative.    Blood pressure (!) 153/74, pulse 84, temperature 98.1 F (36.7 C), temperature source Oral, resp. rate 18, height 5\' 7"  (1.702 m), weight 122.5 kg, SpO2 98 %. Physical Exam Constitutional:      Appearance: He is well-developed.     Comments: Conversant No acute distress  HENT:     Head: Normocephalic and atraumatic.  Eyes:     General: Lids are normal. No scleral icterus.    Pupils: Pupils are equal, round, and reactive to light.     Comments: Pupils are equal round and reactive No lid lag Moist conjunctiva  Neck:     Thyroid: No thyromegaly.     Trachea: No tracheal tenderness.     Comments: No cervical lymphadenopathy Cardiovascular:     Rate and Rhythm: Normal rate and regular rhythm.     Heart sounds: No murmur heard. Pulmonary:     Effort: Pulmonary effort is normal.     Breath sounds: Normal breath sounds. No wheezing or rales.  Abdominal:     Tenderness: There is abdominal tenderness in the right upper quadrant and epigastric area.     Hernia: No hernia is present.  Musculoskeletal:     Cervical back: Normal range of motion and neck supple.  Skin:    General: Skin is warm.     Findings: No rash.     Nails: There is no clubbing.     Comments: Normal skin turgor  Neurological:     Mental Status: He is alert and oriented to person, place, and time.     Comments: Normal gait and station  Psychiatric:        Mood and Affect: Mood normal.        Thought Content: Thought content  normal.  Judgment: Judgment normal.     Comments: Appropriate affect     Assessment/Plan 51 year old male with acute cholecystitis. H/o colon CA obesity  1.  We will proceed to the operating for lap chole 2.All risks and benefits were discussed with the patient to generally include: infection, bleeding, possible need for post op ERCP, damage to the bile ducts, and bile leak. Alternatives were offered and described.  All questions were answered and the patient voiced understanding of the procedure and wishes to proceed at this point with a laparoscopic cholecystectomy   Ralene Ok, MD 06/16/2021, 10:03 AM

## 2021-06-16 NOTE — Telephone Encounter (Signed)
Pt's wife called in stating that pt is going right now for emergency surgery to remove gall bladder. Pt's wife was wanting to know if pcp could get in touch with surgeon to possible to do biopsy while pt is undergoing emergency surgery. Please advise.  Cb#: [phone]

## 2021-06-16 NOTE — ED Triage Notes (Signed)
°  Patient comes in with upper abdominal pain that started earlier today and has gotten progressively worse.  Patient was diagnosed with mesenteric mass in 12/22, followed up with surgeon last week and stated they were in a "wait and watch" with the mass.  Patient states he has had 4-5 episodes of emesis today.  Patient diaphoretic/pale in triage.  Pain 10/10 sharp pain.

## 2021-06-16 NOTE — Telephone Encounter (Signed)
Spoke with pt's wife and she states she was hoping Dr. Dennard Schaumann could order a biopsy of pt's abdominal mass while he was having cholecystectomy done. Advised her we cannot place orders to be completed during emergency surgery in the ED. Also explained pt's current procedure is set as a laparoscopic surgery and depending on location of mass they may not be able to reach it this way. Advised her to speak with surgeon in ED. She voiced understanding. Nothing further needed at this time.

## 2021-06-16 NOTE — Transfer of Care (Signed)
Immediate Anesthesia Transfer of Care Note  Patient: Russell Novak [facility]  Procedure(s) Performed: LAPAROSCOPIC CHOLECYSTECTOMY with ICG dye (Abdomen)  Patient Location: PACU  Anesthesia Type:General  Level of Consciousness: awake, alert  and oriented  Airway & Oxygen Therapy: Patient Spontanous Breathing  Post-op Assessment: Report given to RN and Post -op Vital signs reviewed and stable   Post vital signs: Reviewed and stable  Last Vitals:  Vitals Value Taken Time  BP 151/87 06/16/21 1136  Temp    Pulse 89 06/16/21 1136  Resp 26 06/16/21 1136  SpO2 94 % 06/16/21 1136  Vitals shown include unvalidated device data.  Last Pain:  Vitals:   06/16/21 0951  TempSrc:   PainSc: 1          Complications: No notable events documented.

## 2021-06-16 NOTE — ED Notes (Signed)
Pts O2 saturation decreased to 87%, respirations equal and unlabored, no apparent distress noted.  2L O2 via nasal cannula placed on pt, O2 saturation improved to 94%.  RT made aware.

## 2021-06-16 NOTE — Anesthesia Postprocedure Evaluation (Signed)
Anesthesia Post Note  Patient: Russell Novak [facility]  Procedure(s) Performed: LAPAROSCOPIC CHOLECYSTECTOMY with ICG dye (Abdomen)     Patient location during evaluation: PACU Anesthesia Type: General Level of consciousness: awake and alert, oriented and patient cooperative Pain management: pain level controlled Vital Signs Assessment: post-procedure vital signs reviewed and stable Respiratory status: spontaneous breathing, nonlabored ventilation and respiratory function stable Cardiovascular status: blood pressure returned to baseline and stable Postop Assessment: no apparent nausea or vomiting Anesthetic complications: no   No notable events documented.  Last Vitals:  Vitals:   06/16/21 1150 06/16/21 1205  BP:  139/76  Pulse: 81 82  Resp: 20 (!) 22  Temp:  37.3 C  SpO2: 95% 94%    Last Pain:  Vitals:   06/16/21 1205  TempSrc:   PainSc: 0-No pain                 Pervis Hocking

## 2021-06-16 NOTE — ED Provider Notes (Signed)
[facility] Provider Note   CSN: [id number] Arrival date & time: 06/16/21  0010     History  Chief Complaint  Patient presents with   Abdominal Pain   Emesis    Charls Custer Veras is a 51 y.o. male.  HPI     This is a 51 year old male with a history of mesenteric mass, hypertension who presents with abdominal pain.  Patient reports worsening upper abdominal pain.  He states the pain radiates to his back.  Started around lunchtime today.  He has not had much to eat.  He reports multiple episodes of nonbilious, nonbloody emesis.  States the pain is sharp and 10 out of 10.  He states he has never had pain like this in the past.  Of note, recent diagnosis of mesenteric mass which is being watched by general surgery.  Home Medications Prior to Admission medications   Medication Sig Start Date End Date Taking? Authorizing Provider  Ca Carbonate-Mag Hydroxide (ROLAIDS PO) Take by mouth as needed.    [provider]  famotidine (PEPCID) 20 MG tablet Take 20 mg by mouth daily.    [provider]  hydrochlorothiazide (HYDRODIURIL) 12.5 MG tablet Take 1 tablet (12.5 mg total) by mouth daily. 04/15/21   Susy Frizzle, MD  Simethicone (GAS-X PO) Take by mouth daily as needed.    [provider]  telmisartan (MICARDIS) 40 MG tablet Take 1 tablet (40 mg total) by mouth daily. 04/15/21   Susy Frizzle, MD      Allergies    Patient has no known allergies.    Review of Systems   Review of Systems  Constitutional:  Negative for fever.  Gastrointestinal:  Positive for abdominal pain, nausea and vomiting.  All other systems reviewed and are negative.  Physical Exam Updated Vital Signs BP 126/69    Pulse 80    Temp 97.6 F (36.4 C) (Oral)    Resp 16    Ht 1.702 m (5\' 7" )    Wt 124.7 kg    SpO2 95%    BMI 43.07 kg/m  Physical Exam Vitals and nursing note reviewed.  Constitutional:      Appearance: He is well-developed. He  is obese. He is not ill-appearing.  HENT:     Head: Normocephalic and atraumatic.  Eyes:     Pupils: Pupils are equal, round, and reactive to light.  Cardiovascular:     Rate and Rhythm: Normal rate and regular rhythm.     Heart sounds: Normal heart sounds. No murmur heard. Pulmonary:     Effort: Pulmonary effort is normal. No respiratory distress.     Breath sounds: Normal breath sounds. No wheezing.  Abdominal:     General: Bowel sounds are normal.     Palpations: Abdomen is soft.     Tenderness: There is abdominal tenderness in the right upper quadrant and epigastric area. There is no guarding or rebound.  Musculoskeletal:     Cervical back: Neck supple.  Lymphadenopathy:     Cervical: No cervical adenopathy.  Skin:    General: Skin is warm and dry.  Neurological:     Mental Status: He is alert and oriented to person, place, and time.  Psychiatric:        Mood and Affect: Mood normal.    ED Results / Procedures / Treatments   Labs (all labs ordered are listed, but only abnormal results are displayed) Labs Reviewed  CBC WITH DIFFERENTIAL/PLATELET - Abnormal; Notable  for the following components:      Result Value   WBC 12.3 (*)    Neutro Abs 10.7 (*)    Abs Immature Granulocytes 0.10 (*)    All other components within normal limits  COMPREHENSIVE METABOLIC PANEL - Abnormal; Notable for the following components:   Glucose, Bld 190 (*)    Calcium 10.4 (*)    All other components within normal limits  RESP PANEL BY RT-PCR (FLU A&B, COVID) ARPGX2  LIPASE, BLOOD    EKG None  Radiology CT ABDOMEN PELVIS W CONTRAST  Result Date: 06/16/2021 CLINICAL DATA:  Worsening upper abdominal pain. EXAM: CT ABDOMEN AND PELVIS WITH CONTRAST TECHNIQUE: Multidetector CT imaging of the abdomen and pelvis was performed using the standard protocol following bolus administration of intravenous contrast. RADIATION DOSE REDUCTION: This exam was performed according to the departmental  dose-optimization program which includes automated exposure control, adjustment of the mA and/or kV according to patient size and/or use of iterative reconstruction technique. CONTRAST:  147mL OMNIPAQUE IOHEXOL 300 MG/ML  SOLN COMPARISON:  CT with contrast 05/12/2021 and 04/01/2018, CT without contrast 12/24/2020. FINDINGS: Lower chest: No acute abnormality. Hepatobiliary: 20 cm in length moderately steatotic liver. No mass enhancement. The gallbladder has become distended and there is at least borderline wall thickening. The gallbladder length is 11 cm. 1.9 cm stone appears to be lodged in the gallbladder neck and could be impacted. There is faint pericholecystic edema. There is no biliary dilatation. Pancreas: No mass or ductal dilatation.  Partial pancreatic atrophy. Spleen: No mass enhancement or splenomegaly. Adrenals/Urinary Tract: Stable 1.5 cm right adrenal adenoma. No left adrenal mass is seen. There are scattered bilateral renal cysts and additional too small to characterize subcentimeter hypodensities. 2.1 cm exophytic lesion of the left upper pole above the typical density of cysts, in 2019 was 1.5 cm. There are no stones or hydronephrosis. Stomach/Bowel: No dilatation or wall thickening including the appendix. There are small stones in the appendix. Colonic diverticula without diverticulitis. Patent sigmoid surgical colocolic anastomosis. Vascular/Lymphatic: Aortic atherosclerosis. No enlarged abdominal or pelvic lymph nodes. Reproductive: Normal prostate. 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 There is a small umbilical fat hernia. There is no free air, hemorrhage or fluid. Musculoskeletal: There is degenerative disc disease in the lower thoracic and lumbar region, spondylosis, lower thoracic bridging enthesopathy multiple  levels, and lumbar facet hypertrophy. There is moderate spinal canal encroachment due to disc osteophyte complex or calcified herniated disc at L1-2. There is multilevel degenerative lumbar foraminal stenosis. No worrisome regional bone lesion. IMPRESSION: 1. 2 cm stone in the gallbladder neck which may be impacted, with increased gallbladder dilatation and pericholecystic edema with slight gallbladder wall prominence. Findings worrisome for acute cholecystitis. 2. 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 Differential diagnosis includes postoperative changes, mesenteric infarction, fibrosing mesenteritis and neoplasm including carcinoid tumor. 3. Left renal lesion in the upper pole above the density of fluid, larger than in 2019. Consider follow-up MRI renal protocol. 4. Additional chronic findings. Electronically Signed   By: Telford Nab M.D.   On: 06/16/2021 03:34   US Abdomen Limited RUQ (LIVER/GB)  Result Date: 06/16/2021 CLINICAL DATA:  Right upper quadrant pain with CT suggesting possible cholecystitis and an impacted stone in  the gallbladder neck. EXAM: ULTRASOUND ABDOMEN LIMITED RIGHT UPPER QUADRANT COMPARISON:  CT with contrast earlier today. FINDINGS: Gallbladder: The patient has been medicated with morphine. Sonographic Murphy's sign cannot be assessed. There are similar findings to the CT. Multiple stones are noted, largest is impacted in the neck measuring 1.9 cm and there is free wall thickening up to 3.5 mm, small amount of intraluminal sludge and trace pericholecystic fluid. Findings suspicious for acute cholecystitis. The gallbladder is distended approaching 12 cm in length. Common bile duct: Diameter: 3.1 mm. Liver: No focal lesion identified. 20 cm in length liver is noted with moderate increased echogenicity of steatosis. Portal vein is patent on color Doppler imaging with normal  direction of blood flow towards the liver. Other: None. IMPRESSION: 1. Distended gallbladder with 1.9 cm impacted stone in the neck, sludge, wall thickening and pericholecystic fluid. Findings most likely are due to acute cholecystitis. Sonographic Murphy's sign unable to be evaluated due to patient medication with morphine. 2. Fatty liver. Electronically Signed   By: Telford Nab M.D.   On: 06/16/2021 04:22    Procedures Procedures    Medications Ordered in ED Medications  cefTRIAXone (ROCEPHIN) 1 g in sodium chloride 0.9 % 100 mL IVPB (1 g Intravenous New Bag/Given 06/16/21 0521)  morphine 4 MG/ML injection 4 mg (has no administration in time range)  sodium chloride 0.9 % bolus 1,000 mL (0 mLs Intravenous Stopped 06/16/21 0250)  ondansetron (ZOFRAN) injection 4 mg (4 mg Intravenous Given 06/16/21 0137)  morphine 4 MG/ML injection 4 mg (4 mg Intravenous Given 06/16/21 0149)  iohexol (OMNIPAQUE) 300 MG/ML solution 100 mL (100 mLs Intravenous Contrast Given 06/16/21 0256)    ED Course/ Medical Decision Making/ A&P Clinical Course as of 06/16/21 0546  Mon Jun 16, 2021  0500 Spoke with Dr. Zenia Resides, general surgery.  Patient accepted to preop.  IV Rocephin ordered.  COVID testing pending. [CH]    Clinical Course User Index [CH] Niquita Digioia, Barbette Hair, MD                           Medical Decision Making  This patient presents to the ED for concern of abdominal pain, this involves an extensive number of treatment options, and is a complaint that carries with it a high risk of complications and morbidity.  The differential diagnosis includes gastritis, gastroenteritis, appendicitis, pancreatitis, cholecystitis.  MDM:    This is a 51 year old male with a worsening abdominal pain.  He is ill-appearing but nontoxic on exam.  Patient has tenderness in the epigastrium and right upper quadrant.  Highly suspect pancreatic or gallbladder pathology.  Given recent mesenteric mass, this could also be a  consideration.  Patient was given pain and nausea medication.  Labs reviewed.  Patient with leukocytosis to 12.3.  LFTs and lipase are normal.  Will obtain CT scan given ongoing pain.  CT scan reviewed and shows a large stone in the gallbladder neck.  Follow-up right upper quadrant ultrasound ordered and concerning for cholecystitis.  I have updated the patient and his family.  He is currently pain controlled.  Discussed with general surgery, Dr. Zenia Resides.  He will be transferred to [facility] for likely gallbladder removal.  He was dosed IV Rocephin and redosed pain medication. (Labs, imaging)  Labs: I Ordered, and personally interpreted labs.  The pertinent results include: CBC, CMP, lipase.  CBC with leukocytosis  Imaging Studies ordered: I ordered imaging studies including CT abdomen pelvis, right  upper quadrant ultrasound with evidence of cholecystitis I independently visualized and interpreted imaging. I agree with the radiologist interpretation  Additional history obtained from wife.  External records from outside source obtained and reviewed including outpatient notes  Critical Interventions: IV pain control and antibiotics  Consultations: I requested consultation with the general surgery,  and discussed lab and imaging findings as well as pertinent plan - they recommend: Admission and cholecystectomy  Cardiac Monitoring: The patient was maintained on a cardiac monitor.  I personally viewed and interpreted the cardiac monitored which showed an underlying rhythm of: Sinus rhythm  Reevaluation: After the interventions noted above, I reevaluated the patient and found that they have :improved  Social Determinants of Health: Family support at the bedside  Disposition: Admit  Co morbidities that complicate the patient evaluation  Past Medical History:  Diagnosis Date   Colon cancer ([place]) 2019   in polyp removed during colonoscopy 03/2018, planned sigmoidectomy   GERD  (gastroesophageal reflux disease)    occasional   Hypertension    Seizures ([place])    childhood-none since age of 51 y.o.     Medicines Meds ordered this encounter  Medications   sodium chloride 0.9 % bolus 1,000 mL   ondansetron (ZOFRAN) injection 4 mg   morphine 4 MG/ML injection 4 mg   iohexol (OMNIPAQUE) 300 MG/ML solution 100 mL   cefTRIAXone (ROCEPHIN) 1 g in sodium chloride 0.9 % 100 mL IVPB    Order Specific Question:   Antibiotic Indication:    Answer:   Intra-abdominal   morphine 4 MG/ML injection 4 mg    I have reviewed the patients home medicines and have made adjustments as needed  Problem List / ED Course: Problem List Items Addressed This Visit   None Visit Diagnoses     Cholecystitis    -  Primary   History of gallstones       Relevant Orders   US Abdomen Limited RUQ (LIVER/GB) (Completed)                   Final Clinical Impression(s) / ED Diagnoses Final diagnoses:  History of gallstones  Cholecystitis    Rx / DC Orders ED Discharge Orders     None         Merryl Hacker, MD 06/16/21 [phone]

## 2021-06-17 ENCOUNTER — Encounter (HOSPITAL_COMMUNITY): Payer: Self-pay | Admitting: General Surgery

## 2021-06-17 LAB — SURGICAL PATHOLOGY

## 2021-08-04 ENCOUNTER — Telehealth: Payer: Self-pay | Admitting: Gastroenterology

## 2021-08-04 NOTE — Telephone Encounter (Signed)
T1 N0 M0, which is stage 1 ?

## 2021-08-04 NOTE — Telephone Encounter (Signed)
Patient's wife, Helene Kelp, called.  They are in the process of getting new insurance and the insurance company needs to know the stage and T-level of patient's cancer.  Please call patient's wife and advise.  Thank you. ?

## 2021-08-04 NOTE — Telephone Encounter (Signed)
Returned call to patient's wife with information provided by Dr. Loletha Carrow. Pt's wife verbalized understanding and had no concerns at the end of the call. ?

## 2022-02-05 ENCOUNTER — Ambulatory Visit: Payer: BC Managed Care – PPO | Admitting: Family Medicine

## 2022-02-05 VITALS — BP 138/72 | HR 73 | Temp 97.5°F | Ht 67.0 in | Wt 275.0 lb

## 2022-02-05 DIAGNOSIS — M542 Cervicalgia: Secondary | ICD-10-CM

## 2022-02-05 MED ORDER — CYCLOBENZAPRINE HCL 10 MG PO TABS: 10.0000 mg | ORAL_TABLET | Freq: Three times a day (TID) | ORAL | 0 refills | Status: DC | PRN

## 2022-02-05 MED ORDER — PREDNISONE 20 MG PO TABS: ORAL_TABLET | ORAL | 0 refills | Status: DC

## 2022-02-05 NOTE — Progress Notes (Signed)
Subjective:    Patient ID: Russell Novak [facility], male    DOB: 12-Oct-1970, 51 y.o.   MRN: [id number]  Patient is a very pleasant 51 year old Caucasian gentleman with a history of colon cancer with a questionable spot seen on the CT scan in the abdomen that they are monitoring.  He states for the last 6 to 7 months he has been getting almost daily headaches.  The headaches originate at the base of the occiput and in the upper neck and then radiate over the posterior aspect of his head up to the crown of his head.  They sound like tension headaches.  He denies any neurologic symptoms such as numbness or tingling in his arms.  He denies any weakness in his arms or neurologic deficits.  His blood pressure today is excellent.  He does have a history of snoring.  He has never been evaluated for sleep apnea.  He denies any aura with a headache.  He denies any fevers or chills.   Past Medical History:  Diagnosis Date   Colon cancer ([place]) 2019   in polyp removed during colonoscopy 03/2018, planned sigmoidectomy   GERD (gastroesophageal reflux disease)    occasional   Hypertension    Seizures ([place])    childhood-none since age of 51 y.o.   Past Surgical History:  Procedure Laterality Date   CHOLECYSTECTOMY N/A 06/16/2021   Procedure: LAPAROSCOPIC CHOLECYSTECTOMY with ICG dye;  Surgeon: Ralene Ok, MD;  Location: [facility];  Service: General;  Laterality: N/A;   COLON SURGERY  05/20/2018   18 inches removed    COLONOSCOPY     TONSILLECTOMY     Current Outpatient Medications on File Prior to Visit  Medication Sig Dispense Refill   Ca Carbonate-Mag Hydroxide (ROLAIDS PO) Take by mouth as needed.     famotidine (PEPCID) 20 MG tablet Take 20 mg by mouth daily.     hydrochlorothiazide (HYDRODIURIL) 12.5 MG tablet Take 1 tablet (12.5 mg total) by mouth daily. 90 tablet 3   oxyCODONE (ROXICODONE) 5 MG immediate release tablet Take 1 tablet (5 mg total) by mouth every 6 (six) hours as needed for  breakthrough pain. 15 tablet 0   Simethicone (GAS-X PO) Take by mouth daily as needed.     telmisartan (MICARDIS) 40 MG tablet Take 1 tablet (40 mg total) by mouth daily. 90 tablet 3   No current facility-administered medications on file prior to visit.   No Known Allergies Social History   Socioeconomic History   Marital status: Married    Spouse name: Not on file   Number of children: 2   Years of education: Not on file   Highest education level: Not on file  Occupational History   Occupation: [employer] manager  Tobacco Use   Smoking status: Former    Types: Cigarettes    Quit date: 2007    Years since quitting: 16.6   Smokeless tobacco: Never   Tobacco comments:    quit 2005  Vaping Use   Vaping Use: Never used  Substance and Sexual Activity   Alcohol use: Yes    Comment: occasional   Drug use: No   Sexual activity: Yes    Partners: Female  Other Topics Concern   Not on file  Social History Narrative   Not on file   Social Determinants of Health   Financial Resource Strain: Not on file  Food Insecurity: Not on file  Transportation Needs: Not on file  Physical Activity: Not on  file  Stress: Not on file  Social Connections: Not on file  Intimate Partner Violence: Not on file   Family History  Problem Relation Age of Onset   Lung cancer Mother    Other Father        neck tumor   Colon polyps Father    Esophageal cancer Paternal Uncle    Breast cancer Paternal Aunt    Prostate cancer Paternal Uncle    Diabetes Paternal Uncle    Stroke Paternal Uncle    Rectal cancer Neg Hx    Colitis Neg Hx    Colon cancer Neg Hx    Stomach cancer Neg Hx   ' He does have a cousin who was recently diagnosed with prostate cancer. Review of Systems  Gastrointestinal:  Positive for anal bleeding and blood in stool.  All other systems reviewed and are negative.      Objective:   Physical Exam Vitals reviewed.  Constitutional:      General: He is not in acute  distress.    Appearance: He is well-developed. He is not diaphoretic.  HENT:     Head: Normocephalic and atraumatic.     Right Ear: External ear normal.     Left Ear: External ear normal.     Nose: Nose normal.     Mouth/Throat:     Pharynx: No oropharyngeal exudate.  Eyes:     General: No scleral icterus.       Right eye: No discharge.        Left eye: No discharge.     Conjunctiva/sclera: Conjunctivae normal.     Pupils: Pupils are equal, round, and reactive to light.  Neck:     Thyroid: No thyromegaly.     Vascular: No JVD.     Trachea: No tracheal deviation.   Cardiovascular:     Rate and Rhythm: Normal rate and regular rhythm.     Heart sounds: Normal heart sounds. No murmur heard.    No friction rub. No gallop.  Pulmonary:     Effort: Pulmonary effort is normal. No respiratory distress.     Breath sounds: Normal breath sounds. No stridor. No wheezing or rales.  Chest:     Chest wall: No tenderness.  Abdominal:     General: Bowel sounds are normal. There is no distension.     Palpations: Abdomen is soft. There is no mass.     Tenderness: There is no abdominal tenderness. There is no guarding.  Musculoskeletal:        General: No tenderness or deformity. Normal range of motion.     Cervical back: Normal range of motion and neck supple. No crepitus. No pain with movement, spinous process tenderness or muscular tenderness. Normal range of motion.  Lymphadenopathy:     Cervical: No cervical adenopathy.  Skin:    General: Skin is warm.     Coloration: Skin is not pale.     Findings: No erythema or rash.  Neurological:     Mental Status: He is alert and oriented to person, place, and time.     Cranial Nerves: No cranial nerve deficit.     Sensory: No sensory deficit.     Motor: No abnormal muscle tone.     Coordination: Coordination normal.     Deep Tendon Reflexes: Reflexes normal.           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  This is the most likely diagnosis.  Begin a prednisone taper pack coupled with Flexeril 5 mg every 8 hours as needed.  Given his history of malignancy I would like to get a x-ray of the neck just to evaluate for any skeletal pathology.  Also obtain a CBC and a CMP.  If the prednisone and Flexeril helps the headaches, I will transition to a more long-term safer option to manage tension headaches driven by musculoskeletal problems in the neck

## 2022-02-06 ENCOUNTER — Ambulatory Visit
Admission: RE | Admit: 2022-02-06 | Discharge: 2022-02-06 | Disposition: A | Discharge status: Home / Self Care | Payer: BC Managed Care – PPO | Source: Ambulatory Visit | Attending: Family Medicine | Admitting: Family Medicine

## 2022-02-06 DIAGNOSIS — M542 Cervicalgia: Secondary | ICD-10-CM

## 2022-02-07 LAB — CBC WITH DIFFERENTIAL/PLATELET
Absolute Monocytes: 426 cells/uL (ref 200–950)
Basophils Absolute: 28 cells/uL (ref 0–200)
Basophils Relative: 0.4 %
Eosinophils Absolute: 327 cells/uL (ref 15–500)
Eosinophils Relative: 4.6 %
HCT: 46.9 % (ref 38.5–50.0)
Hemoglobin: 15.9 g/dL (ref 13.2–17.1)
Lymphs Abs: 2158 cells/uL (ref 850–3900)
MCH: 29.3 pg (ref 27.0–33.0)
MCHC: 33.9 g/dL (ref 32.0–36.0)
MCV: 86.4 fL (ref 80.0–100.0)
MPV: 10.4 fL (ref 7.5–12.5)
Monocytes Relative: 6 %
Neutro Abs: 4161 cells/uL (ref 1500–7800)
Neutrophils Relative %: 58.6 %
Platelets: 242 10*3/uL (ref 140–400)
RBC: 5.43 10*6/uL (ref 4.20–5.80)
RDW: 13.7 % (ref 11.0–15.0)
Total Lymphocyte: 30.4 %
WBC: 7.1 10*3/uL (ref 3.8–10.8)

## 2022-02-07 LAB — COMPLETE METABOLIC PANEL WITH GFR
AG Ratio: 1.7 (calc) (ref 1.0–2.5)
ALT: 34 U/L (ref 9–46)
AST: 22 U/L (ref 10–35)
Albumin: 4 g/dL (ref 3.6–5.1)
Alkaline phosphatase (APISO): 52 U/L (ref 35–144)
BUN: 17 mg/dL (ref 7–25)
CO2: 29 mmol/L (ref 20–32)
Calcium: 9.3 mg/dL (ref 8.6–10.3)
Chloride: 103 mmol/L (ref 98–110)
Creat: 1.06 mg/dL (ref 0.70–1.30)
Globulin: 2.3 g/dL (calc) (ref 1.9–3.7)
Glucose, Bld: 209 mg/dL — ABNORMAL HIGH (ref 65–99)
Potassium: 4.3 mmol/L (ref 3.5–5.3)
Sodium: 139 mmol/L (ref 135–146)
Total Bilirubin: 0.5 mg/dL (ref 0.2–1.2)
Total Protein: 6.3 g/dL (ref 6.1–8.1)
eGFR: 84 mL/min/{1.73_m2} (ref >=60)

## 2022-02-07 LAB — TEST AUTHORIZATION

## 2022-02-07 LAB — HEMOGLOBIN A1C W/OUT EAG: Hgb A1c MFr Bld: 5.6 % of total Hgb (ref <5.7)

## 2022-02-24 NOTE — Progress Notes (Signed)
Pt advised via My Chart message: Dr. Dennard Schaumann replied to your question regarding your headaches. He said, he recommends that you  start mobic 15 mg by mouth daily and recheck in 1 week if headache is not better. The prescription has been sent to [facility] on [address]. Thank you! Mjp,lpn

## 2022-02-25 ENCOUNTER — Other Ambulatory Visit: Payer: Self-pay

## 2022-02-25 DIAGNOSIS — M542 Cervicalgia: Secondary | ICD-10-CM

## 2022-02-25 MED ORDER — MELOXICAM 15 MG PO TABS: 15.0000 mg | ORAL_TABLET | Freq: Every day | ORAL | 1 refills | Status: DC

## 2022-04-25 ENCOUNTER — Other Ambulatory Visit: Payer: Self-pay | Admitting: Family Medicine

## 2022-05-01 ENCOUNTER — Other Ambulatory Visit: Payer: Self-pay | Admitting: Family Medicine

## 2022-05-01 NOTE — Telephone Encounter (Signed)
PT NEED PHYSICAL APPT W/PCP FOR FUTURE REFILLS

## 2022-05-05 NOTE — Telephone Encounter (Signed)
Sent message to patient via MyChart to request call back for scheduling.

## 2022-05-28 ENCOUNTER — Other Ambulatory Visit: Payer: Self-pay | Admitting: Family Medicine

## 2022-06-01 ENCOUNTER — Other Ambulatory Visit: Payer: Self-pay | Admitting: Family Medicine

## 2022-06-03 ENCOUNTER — Other Ambulatory Visit: Payer: Self-pay

## 2022-06-03 ENCOUNTER — Telehealth: Payer: Self-pay

## 2022-06-03 DIAGNOSIS — I10 Essential (primary) hypertension: Secondary | ICD-10-CM

## 2022-06-03 MED ORDER — HYDROCHLOROTHIAZIDE 12.5 MG PO TABS: 12.5000 mg | ORAL_TABLET | Freq: Every day | ORAL | 0 refills | Status: DC

## 2022-06-03 NOTE — Telephone Encounter (Signed)
.  Prescription Request  06/03/2022  Is this a "Controlled Substance" medicine? No  LOV: 02/21/22  What is the name of the medication or equipment? hydrochlorothiazide (HYDRODIURIL) 12.5 MG tablet [[id number]]   Have you contacted your pharmacy to request a refill? Yes   Which pharmacy would you like this sent to?  [id number] [id number] [id number]   Patient notified that their request is being sent to the clinical staff for review and that they should receive a response within 2 business days.   Please advise at Mobile [phone](mobile)  Pt called in stating that pt only has 2 pills of this med left.

## 2022-06-09 ENCOUNTER — Ambulatory Visit: Payer: BC Managed Care – PPO | Admitting: Family Medicine

## 2022-06-09 VITALS — BP 136/82 | HR 74 | Temp 98.8°F | Ht 67.0 in | Wt 276.0 lb

## 2022-06-09 DIAGNOSIS — M542 Cervicalgia: Secondary | ICD-10-CM

## 2022-06-09 DIAGNOSIS — I10 Essential (primary) hypertension: Secondary | ICD-10-CM | POA: Diagnosis not present

## 2022-06-09 MED ORDER — MELOXICAM 15 MG PO TABS: 15.0000 mg | ORAL_TABLET | Freq: Every day | ORAL | 5 refills | Status: DC

## 2022-06-09 NOTE — Progress Notes (Signed)
Subjective:    Patient ID: Russell Novak [facility], male    DOB: 07-26-1970, 52 y.o.   MRN: [id number]  Medication Refill    Patient is here today to follow-up his hypertension.  Patient has a history of rectal cancer.  He had surgical resection to remove approximately 12 inches of his colon near his rectum.  He continues to have occasional pain in his neck.  Previously I gave him meloxicam and this seemed to help.  He would like to use this medication sparingly as needed for neck pain.  He denies any chest pain shortness of breath or dyspnea on exertion.  He denies any abdominal pain.  He denies any nausea or vomiting. Past Medical History:  Diagnosis Date   Colon cancer ([place]) 2019   in polyp removed during colonoscopy 03/2018, planned sigmoidectomy   GERD (gastroesophageal reflux disease)    occasional   Hypertension    Seizures ([place])    childhood-none since age of 52 y.o.   Past Surgical History:  Procedure Laterality Date   CHOLECYSTECTOMY N/A 06/16/2021   Procedure: LAPAROSCOPIC CHOLECYSTECTOMY with ICG dye;  Surgeon: Ralene Ok, MD;  Location: [place];  Service: General;  Laterality: N/A;   COLON SURGERY  05/20/2018   18 inches removed    COLONOSCOPY     TONSILLECTOMY     Current Outpatient Medications on File Prior to Visit  Medication Sig Dispense Refill   Ca Carbonate-Mag Hydroxide (ROLAIDS PO) Take by mouth as needed.     famotidine (PEPCID) 20 MG tablet Take 20 mg by mouth daily.     hydrochlorothiazide (HYDRODIURIL) 12.5 MG tablet Take 1 tablet (12.5 mg total) by mouth daily. 30 tablet 0   telmisartan (MICARDIS) 40 MG tablet Take 1 tablet (40 mg total) by mouth daily. 90 tablet 3   No current facility-administered medications on file prior to visit.   No Known Allergies Social History   Socioeconomic History   Marital status: Married    Spouse name: Not on file   Number of children: 2   Years of education: Not on file   Highest education level: Not on file   Occupational History   Occupation: Autobody Office manager  Tobacco Use   Smoking status: Former    Types: Cigarettes    Quit date: 2007    Years since quitting: 17.0   Smokeless tobacco: Never   Tobacco comments:    quit 2005  Vaping Use   Vaping Use: Never used  Substance and Sexual Activity   Alcohol use: Yes    Comment: occasional   Drug use: No   Sexual activity: Yes    Partners: Female  Other Topics Concern   Not on file  Social History Narrative   Not on file   Social Determinants of Health   Financial Resource Strain: Not on file  Food Insecurity: Not on file  Transportation Needs: Not on file  Physical Activity: Not on file  Stress: Not on file  Social Connections: Not on file  Intimate Partner Violence: Not on file   Family History  Problem Relation Age of Onset   Lung cancer Mother    Other Father        neck tumor   Colon polyps Father    Esophageal cancer Paternal Uncle    Breast cancer Paternal Aunt    Prostate cancer Paternal Uncle    Diabetes Paternal Uncle    Stroke Paternal Uncle    Rectal cancer Neg Hx  Colitis Neg Hx    Colon cancer Neg Hx    Stomach cancer Neg Hx   ' Review of Systems  All other systems reviewed and are negative.      Objective:   Physical Exam Vitals reviewed.  Constitutional:      General: He is not in acute distress.    Appearance: He is well-developed. He is not diaphoretic.  HENT:     Head: Normocephalic and atraumatic.     Right Ear: External ear normal.     Left Ear: External ear normal.     Nose: Nose normal.     Mouth/Throat:     Pharynx: No oropharyngeal exudate.  Eyes:     General: No scleral icterus.       Right eye: No discharge.        Left eye: No discharge.     Conjunctiva/sclera: Conjunctivae normal.     Pupils: Pupils are equal, round, and reactive to light.  Neck:     Thyroid: No thyromegaly.     Vascular: No JVD.     Trachea: No tracheal deviation.  Cardiovascular:     Rate and Rhythm:  Normal rate and regular rhythm.     Heart sounds: Normal heart sounds. No murmur heard.    No friction rub. No gallop.  Pulmonary:     Effort: Pulmonary effort is normal. No respiratory distress.     Breath sounds: Normal breath sounds. No stridor. No wheezing or rales.  Chest:     Chest wall: No tenderness.  Abdominal:     General: Bowel sounds are normal. There is no distension.     Palpations: Abdomen is soft. There is no mass.     Tenderness: There is no abdominal tenderness. There is no guarding.  Musculoskeletal:        General: No tenderness or deformity. Normal range of motion.     Cervical back: Normal range of motion and neck supple.  Lymphadenopathy:     Cervical: No cervical adenopathy.  Skin:    General: Skin is warm.     Coloration: Skin is not pale.     Findings: No erythema or rash.  Neurological:     Mental Status: He is alert and oriented to person, place, and time.     Cranial Nerves: No cranial nerve deficit.     Sensory: No sensory deficit.     Motor: No abnormal muscle tone.     Coordination: Coordination normal.     Deep Tendon Reflexes: Reflexes normal.           Assessment & Plan:  Benign essential HTN - Plan: CBC with Differential/Platelet, COMPLETE METABOLIC PANEL WITH GFR, Lipid panel  Neck pain Patient's blood pressure today is well-controlled.  I will check a CMP and lipid panel.  I refilled his meloxicam that he can use sparingly as needed for neck pain and arthritic pain.  Patient is to follow-up with GI for his colon cancer screening/surveillance.

## 2022-06-10 LAB — COMPLETE METABOLIC PANEL WITH GFR
AG Ratio: 1.8 (calc) (ref 1.0–2.5)
ALT: 50 U/L — ABNORMAL HIGH (ref 9–46)
AST: 31 U/L (ref 10–35)
Albumin: 4.6 g/dL (ref 3.6–5.1)
Alkaline phosphatase (APISO): 58 U/L (ref 35–144)
BUN: 14 mg/dL (ref 7–25)
CO2: 30 mmol/L (ref 20–32)
Calcium: 10.6 mg/dL — ABNORMAL HIGH (ref 8.6–10.3)
Chloride: 102 mmol/L (ref 98–110)
Creat: 1 mg/dL (ref 0.70–1.30)
Globulin: 2.5 g/dL (calc) (ref 1.9–3.7)
Glucose, Bld: 92 mg/dL (ref 65–99)
Potassium: 4.7 mmol/L (ref 3.5–5.3)
Sodium: 140 mmol/L (ref 135–146)
Total Bilirubin: 0.4 mg/dL (ref 0.2–1.2)
Total Protein: 7.1 g/dL (ref 6.1–8.1)
eGFR: 90 mL/min/{1.73_m2} (ref >=60)

## 2022-06-10 LAB — LIPID PANEL
Cholesterol: 216 mg/dL — ABNORMAL HIGH (ref <200)
HDL: 59 mg/dL (ref >=40)
LDL Cholesterol (Calc): 130 mg/dL (calc) — ABNORMAL HIGH
Non-HDL Cholesterol (Calc): 157 mg/dL (calc) — ABNORMAL HIGH (ref <130)
Total CHOL/HDL Ratio: 3.7 (calc) (ref <5.0)
Triglycerides: 156 mg/dL — ABNORMAL HIGH (ref <150)

## 2022-06-10 LAB — CBC WITH DIFFERENTIAL/PLATELET
Absolute Monocytes: 970 cells/uL — ABNORMAL HIGH (ref 200–950)
Basophils Absolute: 50 cells/uL (ref 0–200)
Basophils Relative: 0.5 %
Eosinophils Absolute: 386 cells/uL (ref 15–500)
Eosinophils Relative: 3.9 %
HCT: 46.6 % (ref 38.5–50.0)
Hemoglobin: 16.1 g/dL (ref 13.2–17.1)
Lymphs Abs: 3386 cells/uL (ref 850–3900)
MCH: 29.4 pg (ref 27.0–33.0)
MCHC: 34.5 g/dL (ref 32.0–36.0)
MCV: 85.2 fL (ref 80.0–100.0)
MPV: 10.9 fL (ref 7.5–12.5)
Monocytes Relative: 9.8 %
Neutro Abs: 5108 cells/uL (ref 1500–7800)
Neutrophils Relative %: 51.6 %
Platelets: 252 10*3/uL (ref 140–400)
RBC: 5.47 10*6/uL (ref 4.20–5.80)
RDW: 12.9 % (ref 11.0–15.0)
Total Lymphocyte: 34.2 %
WBC: 9.9 10*3/uL (ref 3.8–10.8)

## 2022-06-17 ENCOUNTER — Encounter: Payer: Self-pay | Admitting: Gastroenterology

## 2022-07-01 ENCOUNTER — Other Ambulatory Visit: Payer: Self-pay | Admitting: Family Medicine

## 2022-07-01 DIAGNOSIS — I10 Essential (primary) hypertension: Secondary | ICD-10-CM

## 2022-07-01 MED ORDER — TELMISARTAN 40 MG PO TABS: 40.0000 mg | ORAL_TABLET | Freq: Every day | ORAL | 3 refills | Status: DC

## 2022-07-01 NOTE — Telephone Encounter (Signed)
Requested Prescriptions  Pending Prescriptions Disp Refills   hydrochlorothiazide (HYDRODIURIL) 12.5 MG tablet [Pharmacy Med Name: HYDROCHLOROTHIAZIDE 12.5 MG TB] 90 tablet 0    Sig: TAKE 1 TABLET BY MOUTH EVERY DAY     Cardiovascular: Diuretics - Thiazide Failed - 07/01/2022  1:39 AM      Failed - Valid encounter within last 6 months    Recent Outpatient Visits           1 year ago Hypertension, unspecified type   [place] Susy Frizzle, MD   3 years ago Benign essential HTN   [place] Pickard, Cammie Mcgee, MD   4 years ago General medical exam   [place] Susy Frizzle, MD   7 years ago Low back pain without sciatica, unspecified back pain laterality   [place] Susy Frizzle, MD   8 years ago Vision, loss, sudden, left   [place] Pickard, Cammie Mcgee, MD              Passed - Cr in normal range and within 180 days    Creat  Date Value Ref Range Status  06/09/2022 1.00 0.70 - 1.30 mg/dL Final         Passed - K in normal range and within 180 days    Potassium  Date Value Ref Range Status  06/09/2022 4.7 3.5 - 5.3 mmol/L Final         Passed - Na in normal range and within 180 days    Sodium  Date Value Ref Range Status  06/09/2022 140 135 - 146 mmol/L Final         Passed - Last BP in normal range    BP Readings from Last 1 Encounters:  06/09/22 136/82

## 2022-07-01 NOTE — Telephone Encounter (Signed)
Prescription Request  07/01/2022  Is this a "Controlled Substance" medicine? No  LOV: 06/09/2022  What is the name of the medication or equipment? telmisartan (MICARDIS) 40 MG tablet   Have you contacted your pharmacy to request a refill? Yes   Which pharmacy would you like this sent to?  [id number]: [phone]: [phone]   Patient notified that their request is being sent to the clinical staff for review and that they should receive a response within 2 business days.   [phone]

## 2022-07-01 NOTE — Telephone Encounter (Signed)
Requested Prescriptions  Pending Prescriptions Disp Refills   telmisartan (MICARDIS) 40 MG tablet 90 tablet 3    Sig: Take 1 tablet (40 mg total) by mouth daily.     Cardiovascular:  Angiotensin Receptor Blockers Failed - 07/01/2022 11:57 AM      Failed - Valid encounter within last 6 months    Recent Outpatient Visits           1 year ago Hypertension, unspecified type   [place] Susy Frizzle, MD   3 years ago Benign essential HTN   [place] Susy Frizzle, MD   4 years ago General medical exam   [place] Susy Frizzle, MD   7 years ago Low back pain without sciatica, unspecified back pain laterality   [place] Susy Frizzle, MD   8 years ago Vision, loss, sudden, left   [place] Pickard, Cammie Mcgee, MD              Passed - Cr in normal range and within 180 days    Creat  Date Value Ref Range Status  06/09/2022 1.00 0.70 - 1.30 mg/dL Final         Passed - K in normal range and within 180 days    Potassium  Date Value Ref Range Status  06/09/2022 4.7 3.5 - 5.3 mmol/L Final         Passed - Patient is not pregnant      Passed - Last BP in normal range    BP Readings from Last 1 Encounters:  06/09/22 136/82          LOV 06/09/2022 even though protocol indicating it's been over 6 months due to a glitch in the system.

## 2022-07-20 ENCOUNTER — Encounter: Payer: BC Managed Care – PPO | Admitting: Family Medicine

## 2022-07-29 IMAGING — CT CT ABD-PELV W/ CM
2 of 5 series · 15 of 46 positions shown, 17 images · IV contrast (APPLIED)
Comparison: CT with contrast 05/12/2021 and 04/01/2018, CT without
contrast 12/24/2020.

CLINICAL DATA: Worsening upper abdominal pain.

EXAM:
CT ABDOMEN AND PELVIS WITH CONTRAST
TECHNIQUE: Multidetector CT imaging of the abdomen and pelvis was performed
using the standard protocol following bolus administration of
intravenous contrast.

[Series 2: abd pel w · axial · 0.93mm/px · z∈[-326,+109]mm · 12 of 99 slices shown, 14 images]
[im 6/99  soft-tissue]
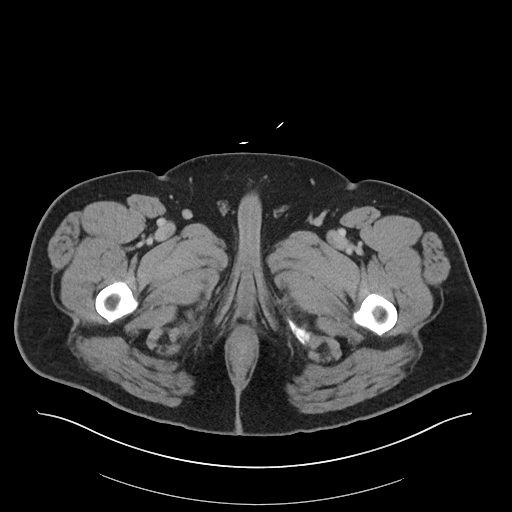
[im 6/99  bone]
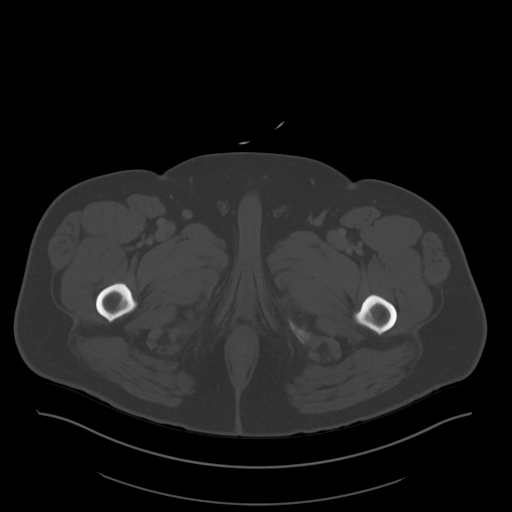
[im 17/99  soft-tissue]
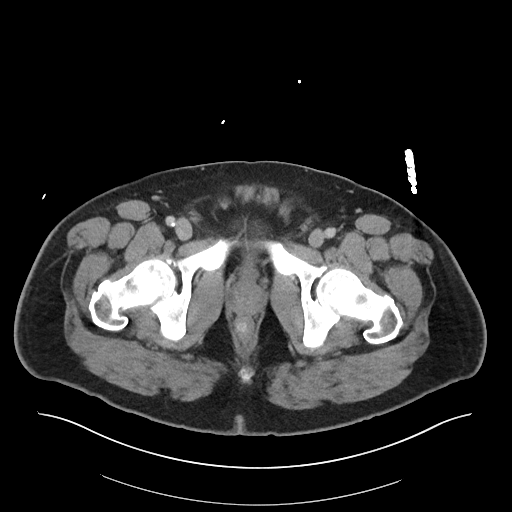
[im 22/99  soft-tissue]
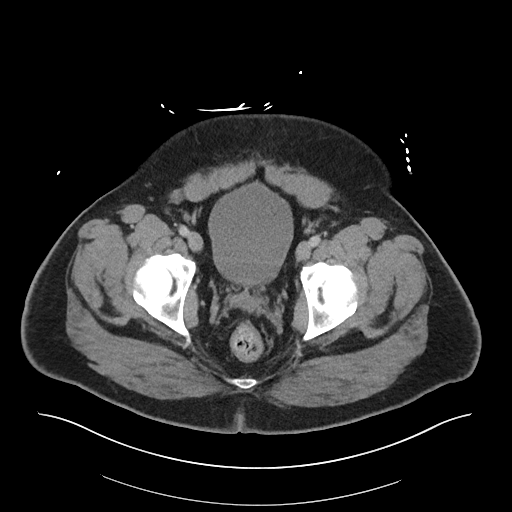
[im 28/99  soft-tissue]
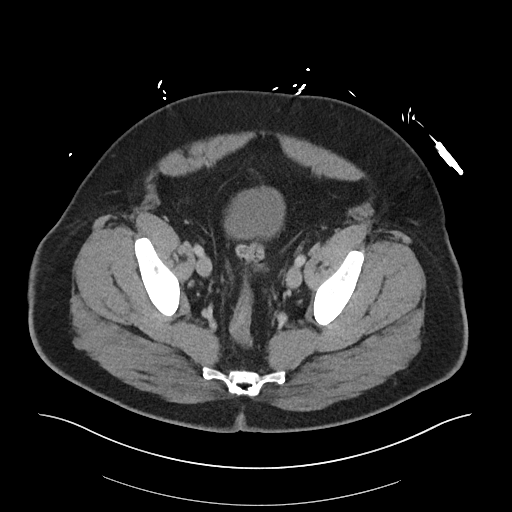
[im 39/99  soft-tissue]
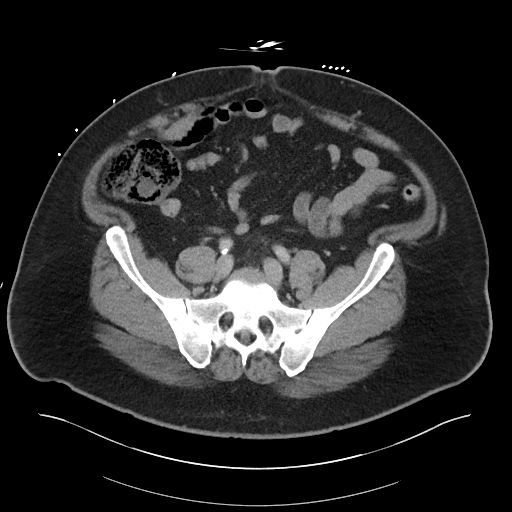
[im 44/99  soft-tissue]
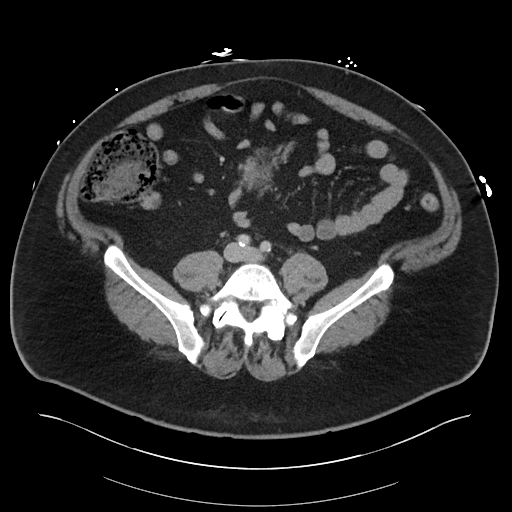
[im 55/99  soft-tissue]
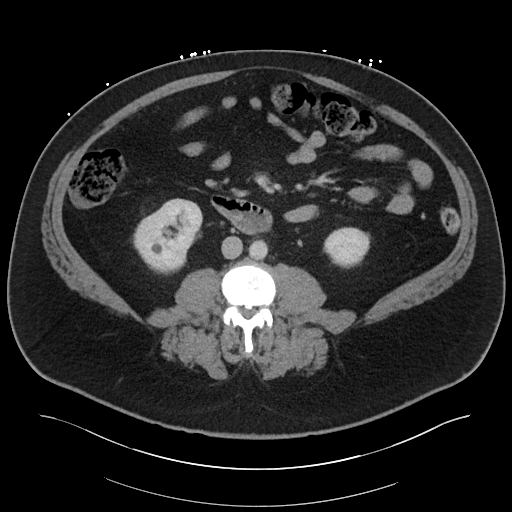
[im 60/99  soft-tissue]
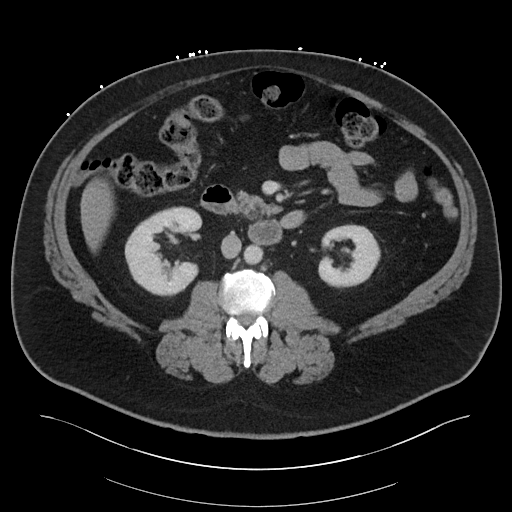
[im 71/99  soft-tissue]
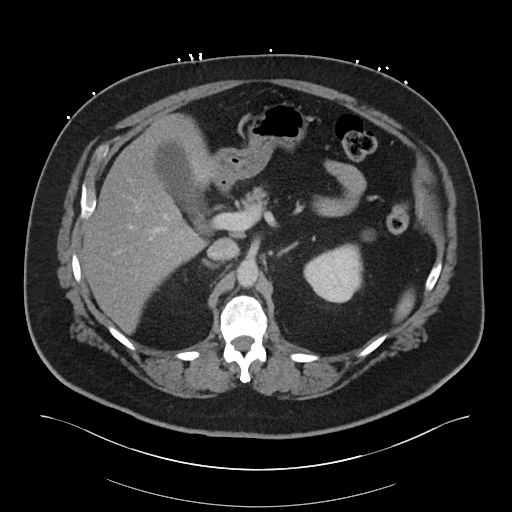
[im 71/99  bone]
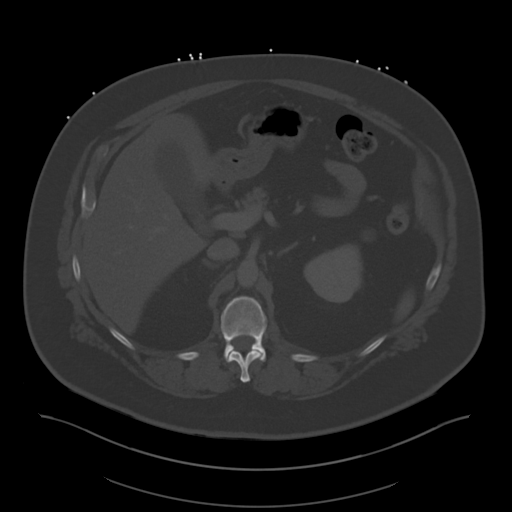
[im 77/99  soft-tissue]
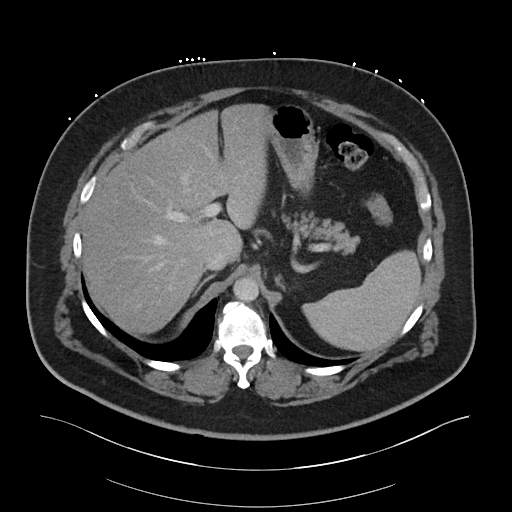
[im 82/99  soft-tissue]
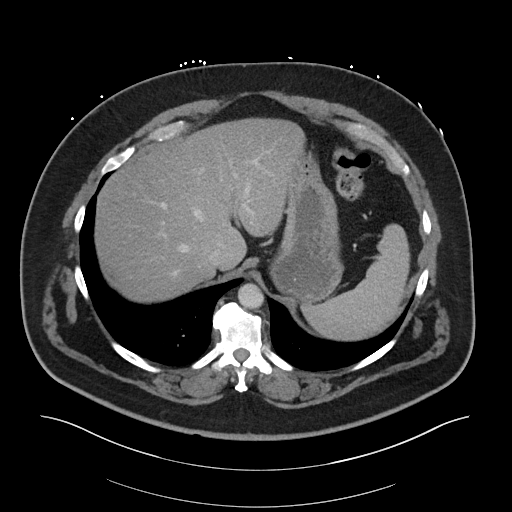
[im 93/99  soft-tissue]
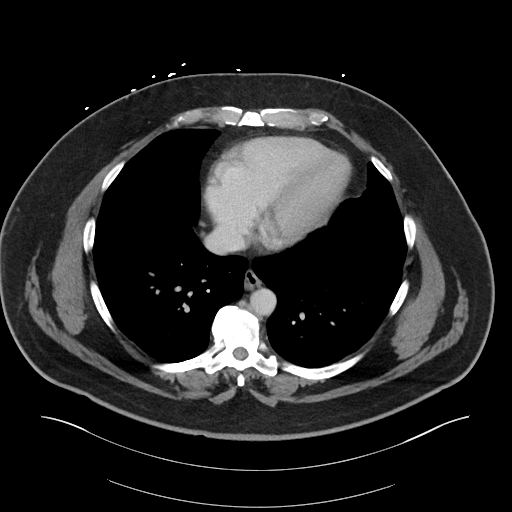

[Series 5: coronal · coronal · 0.95mm/px · 3 of 135 slices shown]
[im 45/135  soft-tissue]
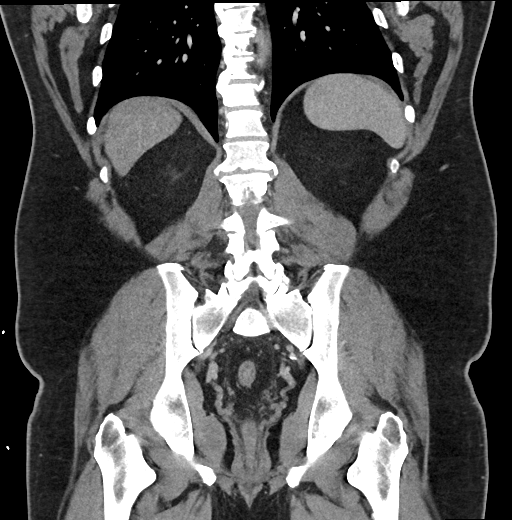
[im 60/135  soft-tissue]
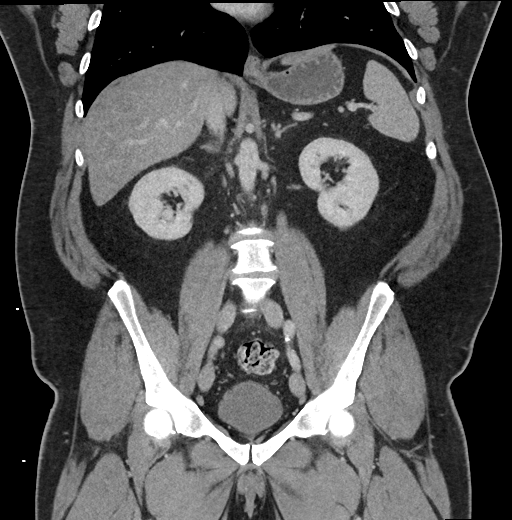
[im 75/135  soft-tissue]
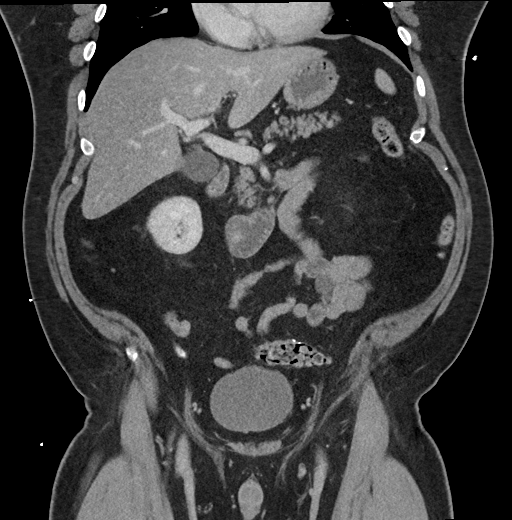

[15 of 46 positions shown; findings below may reference images not displayed]

RADIATION DOSE REDUCTION: This exam was performed according to the
departmental dose-optimization program which includes automated
exposure control, adjustment of the mA and/or kV according to
patient size and/or use of iterative reconstruction technique.

CONTRAST:  100mL OMNIPAQUE IOHEXOL 300 MG/ML  SOLN
FINDINGS: Lower chest: No acute abnormality.

Hepatobiliary: 20 cm in length moderately steatotic liver. No mass
enhancement. The gallbladder has become distended and there is at
least borderline wall thickening. The gallbladder length is 11 cm.
1.9 cm stone appears to be lodged in the gallbladder neck and could
be impacted. There is faint pericholecystic edema. There is no
biliary dilatation.

Pancreas: No mass or ductal dilatation.  Partial pancreatic atrophy.

Spleen: No mass enhancement or splenomegaly.

Adrenals/Urinary Tract: Stable 1.5 cm right adrenal adenoma. No left
adrenal mass is seen. There are scattered bilateral renal cysts and
additional too small to characterize subcentimeter hypodensities.
2.1 cm exophytic lesion of the left upper pole above the typical
density of cysts, in 7167 was 1.5 cm. There are no stones or
hydronephrosis.

Stomach/Bowel: No dilatation or wall thickening including the
appendix. There are small stones in the appendix. Colonic
diverticula without diverticulitis. Patent sigmoid surgical
colocolic anastomosis.

Vascular/Lymphatic: Aortic atherosclerosis. No enlarged abdominal or
pelvic lymph nodes.

Reproductive: Normal prostate.

Other: Again noted is an ill-defined infiltrative mesenteric root
mass in the central abdomen , new from 1391 but no noteworthy
changes compared with last year's studies, measuring 6.6 x 5.7 cm on
series 2 axial 51. There is a small umbilical fat hernia. There is
no free air, hemorrhage or fluid.

Musculoskeletal: There is degenerative disc disease in the lower
thoracic and lumbar region, spondylosis, lower thoracic bridging
enthesopathy multiple levels, and lumbar facet hypertrophy. There
is moderate spinal canal encroachment due to disc osteophyte complex
or calcified herniated disc at L1-2. There is multilevel
degenerative lumbar foraminal stenosis. No worrisome regional bone
lesion.
IMPRESSION: 1. 2 cm stone in the gallbladder neck which may be impacted, with
increased gallbladder dilatation and pericholecystic edema with
slight gallbladder wall prominence. Findings worrisome for acute
cholecystitis.
2. Redemonstrated infiltrative, ill-defined soft tissue masslike
abnormality in the mesenteric root , new from 7167 but unchanged
from last year's studies. Differential diagnosis includes
postoperative changes, mesenteric infarction, fibrosing mesenteritis
and neoplasm including carcinoid tumor.
3. Left renal lesion in the upper pole above the density of fluid,
larger than in 7167. Consider follow-up MRI renal protocol.
4. Additional chronic findings.

## 2022-07-29 IMAGING — US US ABDOMEN LIMITED
1 series · 13 of 25 positions shown · non-contrast
Comparison: CT with contrast earlier today.

CLINICAL DATA: Right upper quadrant pain with CT suggesting
possible cholecystitis and an impacted stone in the gallbladder
neck.

EXAM:
ULTRASOUND ABDOMEN LIMITED RIGHT UPPER QUADRANT

[Series 1: us abdomen limited ruq (liver/gb) · 13 of 97 slices shown]
[im 1/97]
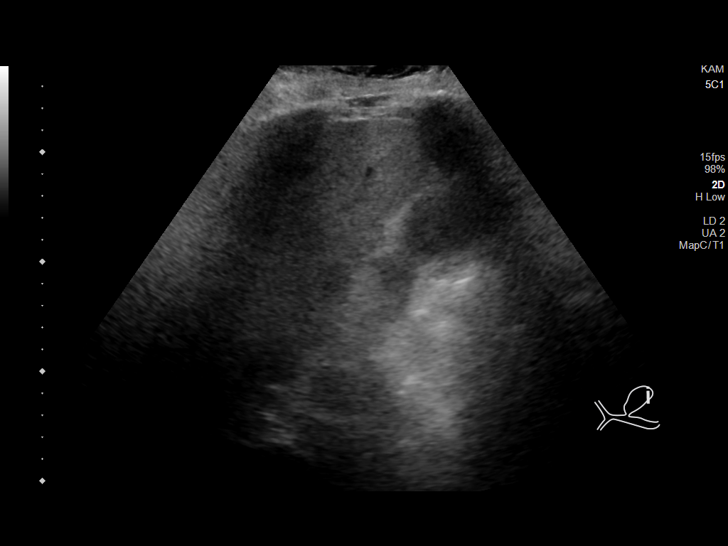
[im 9/97]
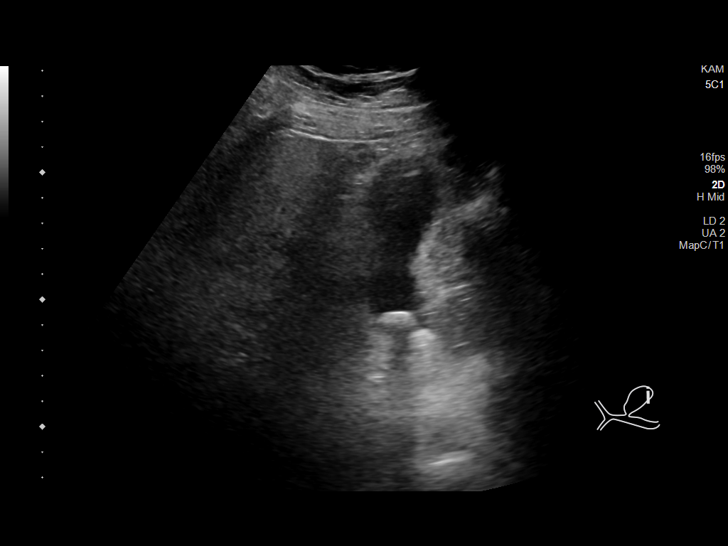
[im 17/97]
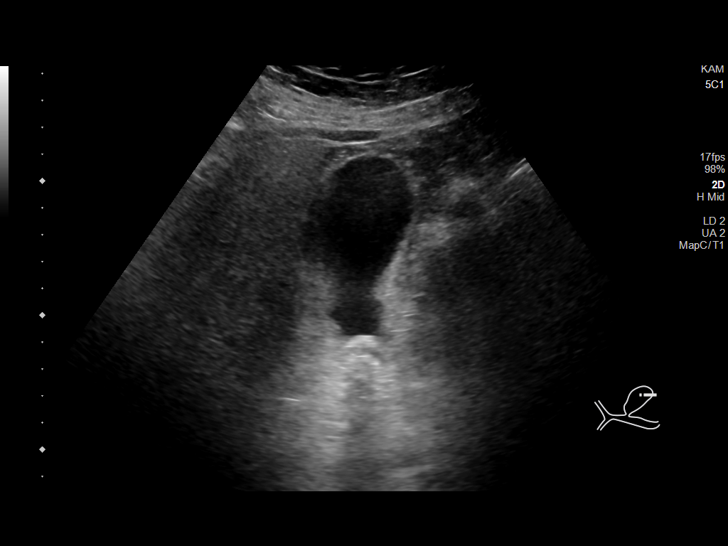
[im 25/97]
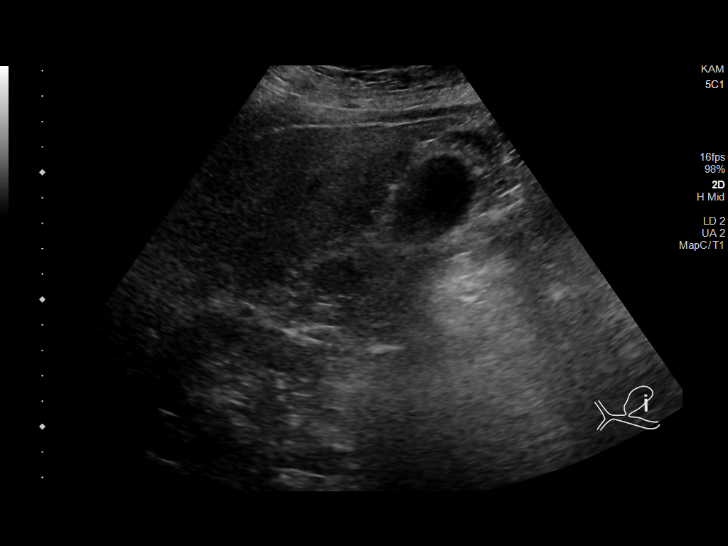
[im 33/97]
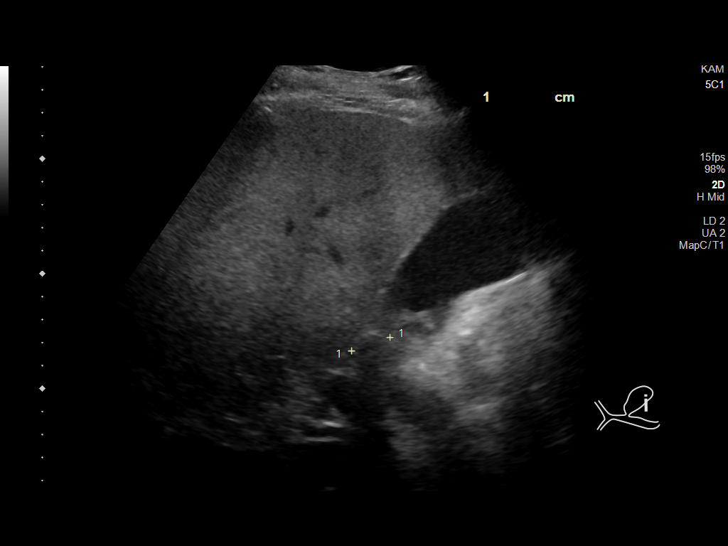
[im 41/97]
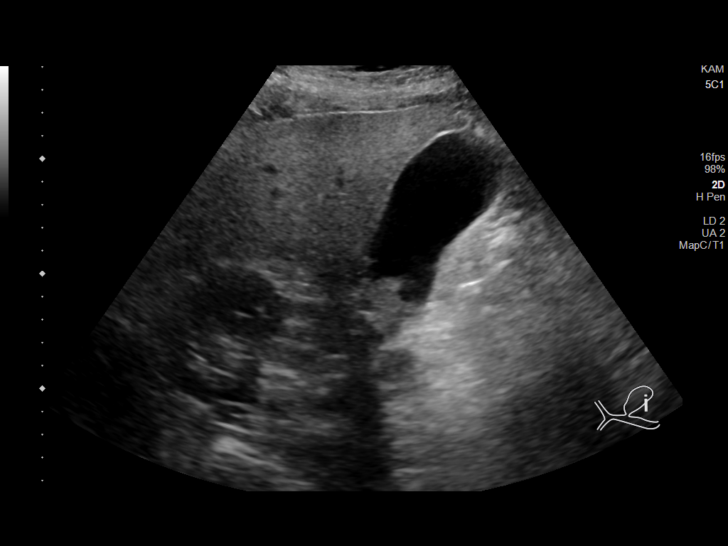
[im 49/97]
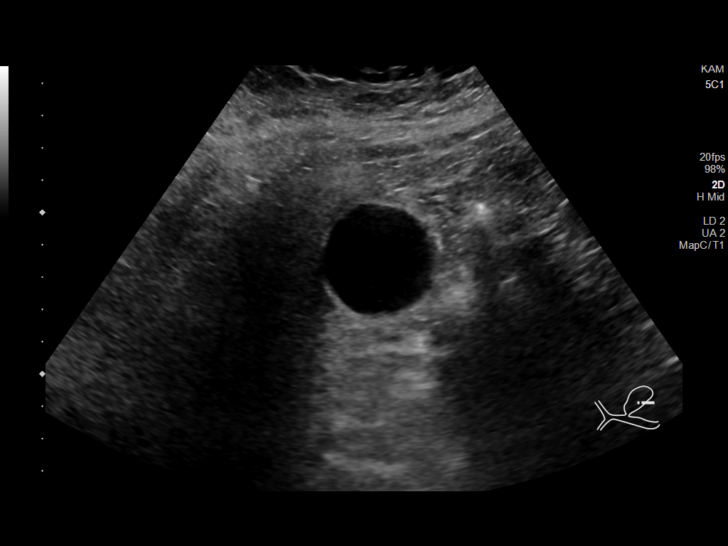
[im 57/97]
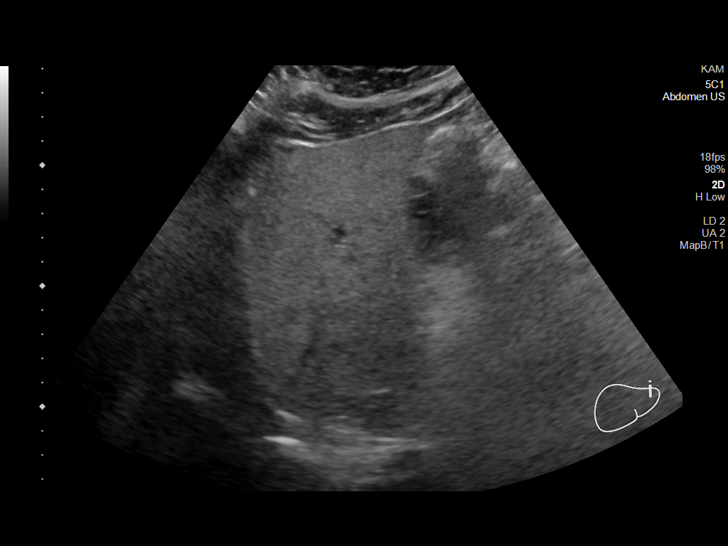
[im 65/97]
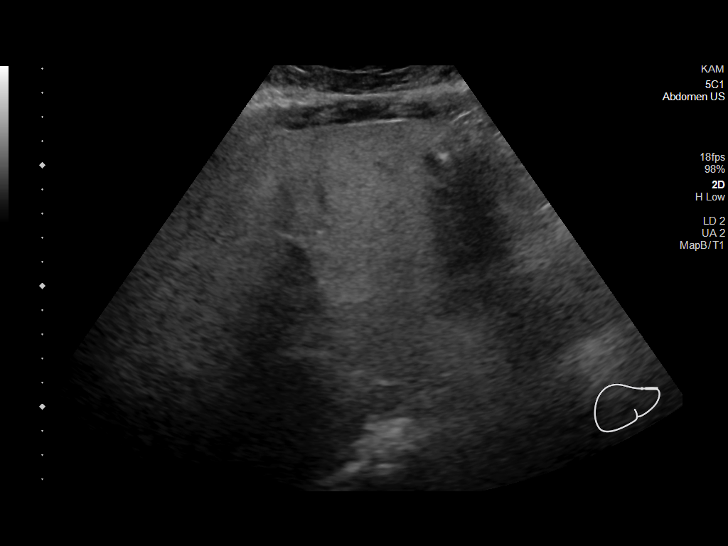
[im 73/97]
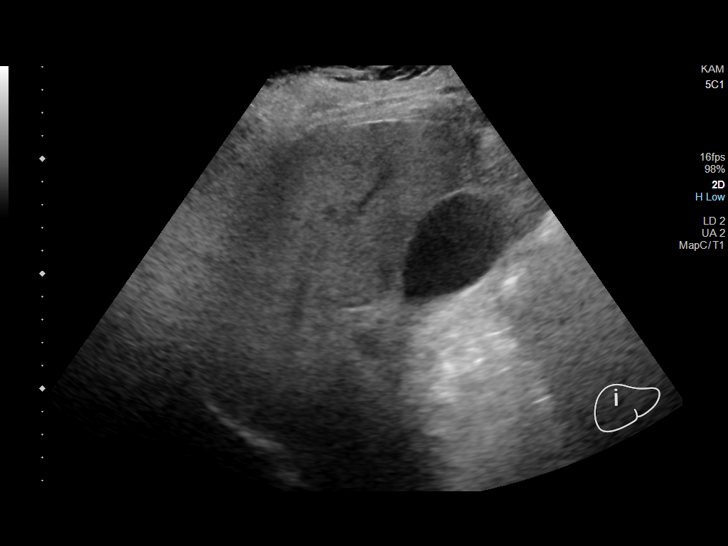
[im 81/97]
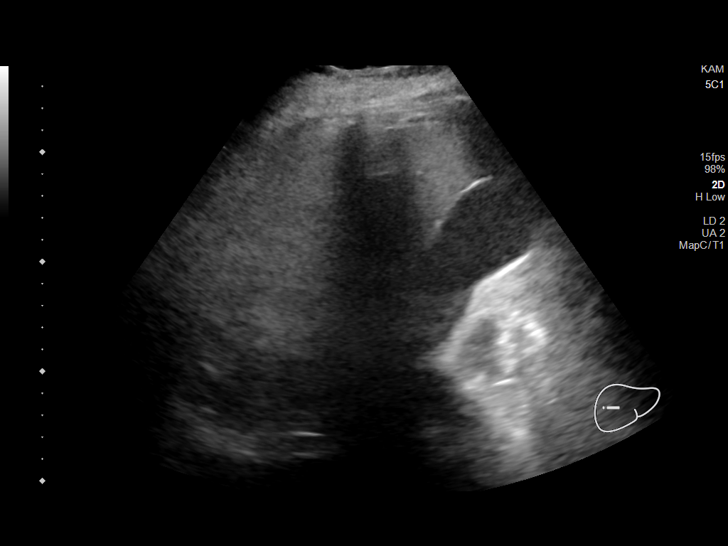
[im 89/97]
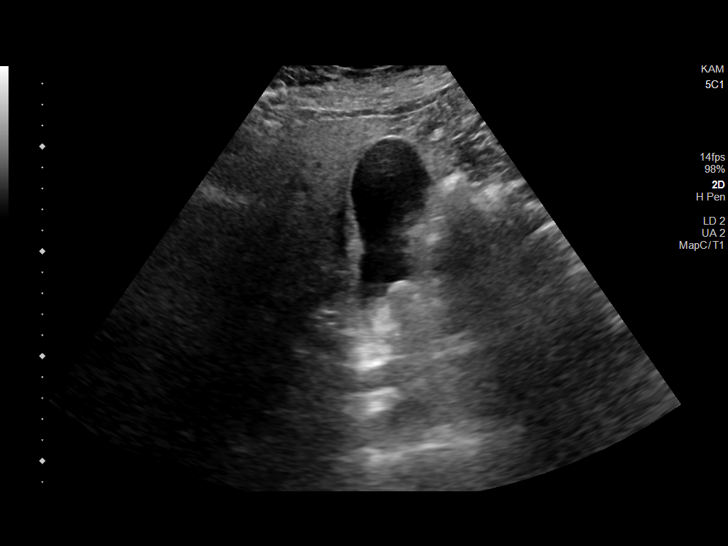
[im 97/97]
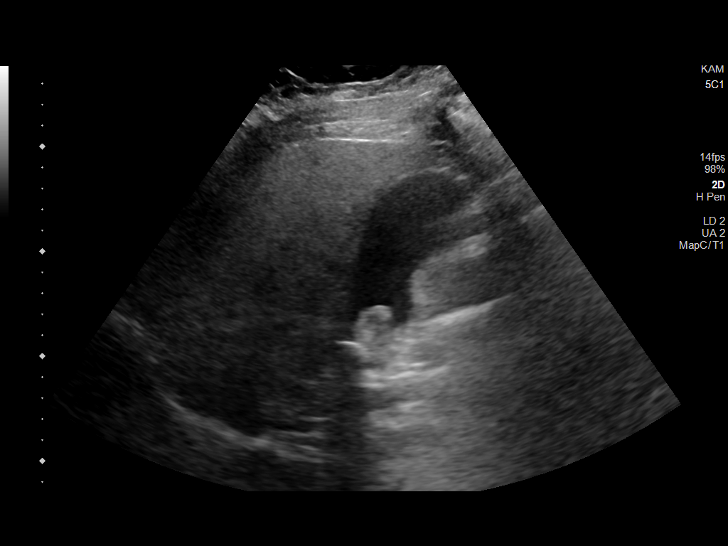

[13 of 25 positions shown; findings below may reference images not displayed]

FINDINGS: Gallbladder:

The patient has been medicated with morphine. Sonographic Murphy's
sign cannot be assessed. There are similar findings to the CT.
Multiple stones are noted, largest is impacted in the neck measuring
1.9 cm and there is free wall thickening up to 3.5 mm, small amount
of intraluminal sludge and trace pericholecystic fluid. Findings
suspicious for acute cholecystitis. The gallbladder is distended
approaching 12 cm in length.

Common bile duct:

Diameter: 3.1 mm.

Liver:

No focal lesion identified. 20 cm in length liver is noted with
moderate increased echogenicity of steatosis. Portal vein is patent
on color Doppler imaging with normal direction of blood flow towards
the liver.

Other: None.
IMPRESSION: 1. Distended gallbladder with 1.9 cm impacted stone in the neck,
sludge, wall thickening and pericholecystic fluid. Findings most
likely are due to acute cholecystitis. Sonographic Murphy's sign
unable to be evaluated due to patient medication with morphine.
2. Fatty liver.

## 2022-09-25 ENCOUNTER — Other Ambulatory Visit: Payer: Self-pay | Admitting: Family Medicine

## 2022-09-25 DIAGNOSIS — I10 Essential (primary) hypertension: Secondary | ICD-10-CM

## 2022-09-25 NOTE — Telephone Encounter (Signed)
OV 06/09/22 Requested Prescriptions  Pending Prescriptions Disp Refills   hydrochlorothiazide (HYDRODIURIL) 12.5 MG tablet [Pharmacy Med Name: HYDROCHLOROTHIAZIDE 12.5 MG TB] 90 tablet 0    Sig: TAKE 1 TABLET BY MOUTH EVERY DAY     Cardiovascular: Diuretics - Thiazide Failed - 09/25/2022  1:58 AM      Failed - Valid encounter within last 6 months    Recent Outpatient Visits           1 year ago Hypertension, unspecified type   [facility] Donita [place], MD   3 years ago Benign essential HTN   [facility] Pickard, Priscille Heidelberg, MD   4 years ago General medical exam   [facility]ita [place], MD   7 years ago Low back pain without sciatica, unspecified back pain laterality   [facility], MD   8 years ago Vision, loss, sudden, left   [facility] Pickard, Priscille Heidelberg, MD              Passed - Cr in normal range and within 180 days    Creat  Date Value Ref Range Status  06/09/2022 1.00 0.70 - 1.30 mg/dL Final         Passed - K in normal range and within 180 days    Potassium  Date Value Ref Range Status  06/09/2022 4.7 3.5 - 5.3 mmol/L Final         Passed - Na in normal range and within 180 days    Sodium  Date Value Ref Range Status  06/09/2022 140 135 - 146 mmol/L Final         Passed - Last BP in normal range    BP Readings from Last 1 Encounters:  06/09/22 136/82

## 2022-11-10 ENCOUNTER — Other Ambulatory Visit: Payer: BC Managed Care – PPO

## 2022-11-10 DIAGNOSIS — E7800 Pure hypercholesterolemia, unspecified: Secondary | ICD-10-CM

## 2022-11-10 DIAGNOSIS — I10 Essential (primary) hypertension: Secondary | ICD-10-CM

## 2022-11-10 LAB — CBC WITH DIFFERENTIAL/PLATELET
Basophils Relative: 0.7 %
MCV: 87.1 fL (ref 80.0–100.0)
Neutrophils Relative %: 54 %
RDW: 13.3 % (ref 11.0–15.0)

## 2022-11-11 LAB — COMPLETE METABOLIC PANEL WITH GFR
Alkaline phosphatase (APISO): 57 U/L (ref 35–144)
CO2: 28 mmol/L (ref 20–32)
Total Bilirubin: 0.4 mg/dL (ref 0.2–1.2)

## 2022-11-11 LAB — CBC WITH DIFFERENTIAL/PLATELET
Absolute Monocytes: 532 cells/uL (ref 200–950)
MCH: 29.4 pg (ref 27.0–33.0)
Monocytes Relative: 7.6 %
Platelets: 255 10*3/uL (ref 140–400)
WBC: 7 10*3/uL (ref 3.8–10.8)

## 2022-11-17 LAB — COMPLETE METABOLIC PANEL WITH GFR
AG Ratio: 2.1 (calc) (ref 1.0–2.5)
Albumin: 4.4 g/dL (ref 3.6–5.1)
Calcium: 9.9 mg/dL (ref 8.6–10.3)
Chloride: 106 mmol/L (ref 98–110)
Sodium: 141 mmol/L (ref 135–146)

## 2022-11-17 LAB — LIPID PANEL
Cholesterol: 193 mg/dL (ref <200)
Triglycerides: 72 mg/dL (ref <150)

## 2022-11-17 LAB — CBC WITH DIFFERENTIAL/PLATELET
Lymphs Abs: 2415 cells/uL (ref 850–3900)
MCHC: 33.8 g/dL (ref 32.0–36.0)

## 2022-11-17 LAB — TEST AUTHORIZATION

## 2022-11-18 LAB — CBC WITH DIFFERENTIAL/PLATELET
Basophils Absolute: 49 cells/uL (ref 0–200)
Eosinophils Absolute: 224 cells/uL (ref 15–500)
Eosinophils Relative: 3.2 %
HCT: 48 % (ref 38.5–50.0)
Hemoglobin: 16.2 g/dL (ref 13.2–17.1)
MPV: 11.1 fL (ref 7.5–12.5)
Neutro Abs: 3780 cells/uL (ref 1500–7800)
RBC: 5.51 10*6/uL (ref 4.20–5.80)
Total Lymphocyte: 34.5 %

## 2022-11-18 LAB — HEMOGLOBIN A1C
Hgb A1c MFr Bld: 5.9 % of total Hgb — ABNORMAL HIGH (ref <5.7)
Mean Plasma Glucose: 123 mg/dL
eAG (mmol/L): 6.8 mmol/L

## 2022-11-18 LAB — LIPID PANEL
HDL: 56 mg/dL (ref >=40)
LDL Cholesterol (Calc): 121 mg/dL (calc) — ABNORMAL HIGH
Non-HDL Cholesterol (Calc): 137 mg/dL (calc) — ABNORMAL HIGH (ref <130)
Total CHOL/HDL Ratio: 3.4 (calc) (ref <5.0)

## 2022-11-18 LAB — COMPLETE METABOLIC PANEL WITH GFR
ALT: 32 U/L (ref 9–46)
AST: 21 U/L (ref 10–35)
BUN: 14 mg/dL (ref 7–25)
Creat: 1.03 mg/dL (ref 0.70–1.30)
Globulin: 2.1 g/dL (calc) (ref 1.9–3.7)
Glucose, Bld: 124 mg/dL — ABNORMAL HIGH (ref 65–99)
Potassium: 4.5 mmol/L (ref 3.5–5.3)
Total Protein: 6.5 g/dL (ref 6.1–8.1)
eGFR: 87 mL/min/{1.73_m2} (ref >=60)

## 2022-11-18 LAB — TEST AUTHORIZATION

## 2022-12-21 ENCOUNTER — Other Ambulatory Visit: Payer: Self-pay | Admitting: Family Medicine

## 2022-12-21 DIAGNOSIS — I10 Essential (primary) hypertension: Secondary | ICD-10-CM

## 2022-12-23 NOTE — Telephone Encounter (Signed)
Requested Prescriptions  Pending Prescriptions Disp Refills   hydrochlorothiazide (HYDRODIURIL) 12.5 MG tablet [Pharmacy Med Name: HYDROCHLOROTHIAZIDE 12.5 MG TB] 90 tablet 0    Sig: TAKE 1 TABLET BY MOUTH EVERY DAY     Cardiovascular: Diuretics - Thiazide Failed - 12/21/2022  8:46 PM      Failed - Valid encounter within last 6 months    Recent Outpatient Visits           1 year ago Hypertension, unspecified type   [facility] [place], MD   3 years ago Benign essential HTN   [facility] Pickard, Priscille [place], MD   5 years ago General medical exam   [facility] [place], MD   7 years ago Low back pain without sciatica, unspecified back pain laterality   [facility], MD   8 years ago Vision, loss, sudden, left   [facility] Pickard, Priscille Heidelberg, MD              Passed - Cr in normal range and within 180 days    Creat  Date Value Ref Range Status  11/10/2022 1.03 0.70 - 1.30 mg/dL Final         Passed - K in normal range and within 180 days    Potassium  Date Value Ref Range Status  11/10/2022 4.5 3.5 - 5.3 mmol/L Final         Passed - Na in normal range and within 180 days    Sodium  Date Value Ref Range Status  11/10/2022 141 135 - 146 mmol/L Final         Passed - Last BP in normal range    BP Readings from Last 1 Encounters:  06/09/22 136/82

## 2023-01-05 ENCOUNTER — Ambulatory Visit: Payer: BC Managed Care – PPO | Admitting: Family Medicine

## 2023-01-05 ENCOUNTER — Encounter: Payer: Self-pay | Admitting: Family Medicine

## 2023-01-05 VITALS — BP 120/72 | HR 61 | Temp 98.7°F | Ht 67.0 in | Wt 274.0 lb

## 2023-01-05 DIAGNOSIS — M5136 Other intervertebral disc degeneration, lumbar region: Secondary | ICD-10-CM

## 2023-01-05 DIAGNOSIS — G8929 Other chronic pain: Secondary | ICD-10-CM | POA: Diagnosis not present

## 2023-01-05 DIAGNOSIS — M5441 Lumbago with sciatica, right side: Secondary | ICD-10-CM

## 2023-01-05 MED ORDER — GABAPENTIN 300 MG PO CAPS: 300.0000 mg | ORAL_CAPSULE | Freq: Three times a day (TID) | ORAL | 3 refills | Status: DC

## 2023-01-05 MED ORDER — PREDNISONE 20 MG PO TABS
ORAL_TABLET | ORAL | 0 refills | Status: AC
Start: 2023-01-05 — End: ?

## 2023-01-05 NOTE — Progress Notes (Signed)
Subjective:    Patient ID: Russell Novak [facility], male    DOB: 06/01/1971, 52 y.o.   MRN: [id number]  Patient is a very pleasant 52 year old gentleman who has been battling low back pain for 6 months.  He states that in February, he did pain in lower back that radiated into his right gluteus down the right leg all the way to the dorsum of his right foot.  He reports it is a nervelike hyperesthesia and paresthesia.  He states the pain is worse in the morning.  When he gets up and starts getting ready, by the time he is finished his shower he is having severe pain.  He takes ibuprofen for the pain with both slightly but is present with him all day long.  He has tried waiting for 6 months without any benefit.  He denies any bowel or bladder incontinence.  He denies any saddle anesthesia.  Any fevers or chills. Past Medical History:  Diagnosis Date   Colon cancer ([facility]) 2019   in polyp removed during colonoscopy 03/2018, planned sigmoidectomy   GERD (gastroesophageal reflux disease)    occasional   Hypertension    Seizures ([facility])    childhood-none since age of 52 y.o.   Past Surgical History:  Procedure Laterality Date   CHOLECYSTECTOMY N/A 06/16/2021   Procedure: LAPAROSCOPIC CHOLECYSTECTOMY with ICG dye;  Surgeon: Axel Filler, MD;  Location: [facility];  Service: General;  Laterality: N/A;   COLON SURGERY  05/20/2018   18 inches removed    COLONOSCOPY     TONSILLECTOMY     Current Outpatient Medications on File Prior to Visit  Medication Sig Dispense Refill   Ca Carbonate-Mag Hydroxide (ROLAIDS PO) Take by mouth as needed.     hydrochlorothiazide (HYDRODIURIL) 12.5 MG tablet TAKE 1 TABLET BY MOUTH EVERY DAY 90 tablet 0   meloxicam (MOBIC) 15 MG tablet Take 1 tablet (15 mg total) by mouth daily. 30 tablet 5   telmisartan (MICARDIS) 40 MG tablet Take 1 tablet (40 mg total) by mouth daily. 90 tablet 3   No current facility-administered medications on file prior to visit.   No Known  Allergies Social History   Socioeconomic History   Marital status: Married    Spouse name: Not on file   Number of children: 2   Years of education: Not on file   Highest education level: Not on file  Occupational History   Occupation: Autobody Psychologist, forensic  Tobacco Use   Smoking status: Former    Current packs/day: 0.00    Types: Cigarettes    Quit date: 2007    Years since quitting: 17.6   Smokeless tobacco: Never   Tobacco comments:    quit 2005  Vaping Use   Vaping status: Never Used  Substance and Sexual Activity   Alcohol use: Yes    Comment: occasional   Drug use: No   Sexual activity: Yes    Partners: Female  Other Topics Concern   Not on file  Social History Narrative   Not on file   Social Determinants of Health   Financial Resource Strain: Not on file  Food Insecurity: Not on file  Transportation Needs: Not on file  Physical Activity: Not on file  Stress: Not on file  Social Connections: Not on file  Intimate Partner Violence: Not on file   Family History  Problem Relation Age of Onset   Lung cancer Mother    Other Father  neck tumor   Colon polyps Father    Esophageal cancer Paternal Uncle    Breast cancer Paternal Aunt    Prostate cancer Paternal Uncle    Diabetes Paternal Uncle    Stroke Paternal Uncle    Rectal cancer Neg Hx    Colitis Neg Hx    Colon cancer Neg Hx    Stomach cancer Neg Hx   ' Review of Systems  All other systems reviewed and are negative.      Objective:   Physical Exam Vitals reviewed.  Constitutional:      General: He is not in acute distress.    Appearance: He is well-developed. He is not diaphoretic.  HENT:     Head: Normocephalic and atraumatic.     Right Ear: External ear normal.     Left Ear: External ear normal.  Neck:     Thyroid: No thyromegaly.     Vascular: No JVD.     Trachea: No tracheal deviation.  Cardiovascular:     Rate and Rhythm: Normal rate and regular rhythm.     Heart sounds: Normal  heart sounds. No murmur heard.    No friction rub. No gallop.  Pulmonary:     Effort: Pulmonary effort is normal. No respiratory distress.     Breath sounds: Normal breath sounds. No stridor. No wheezing or rales.  Chest:     Chest wall: No tenderness.  Musculoskeletal:     Cervical back: Normal range of motion and neck supple.       Back:       Legs:  Lymphadenopathy:     Cervical: No cervical adenopathy.  Skin:    General: Skin is warm.     Coloration: Skin is not pale.     Findings: No erythema or rash.  Neurological:     Mental Status: He is alert and oriented to person, place, and time.     Cranial Nerves: No cranial nerve deficit.     Sensory: No sensory deficit.     Motor: No abnormal muscle tone.     Coordination: Coordination normal.     Deep Tendon Reflexes: Reflexes normal.           Assessment & Plan:  Chronic right-sided low back pain with right-sided sciatica - Plan: MR Lumbar Spine Wo Contrast  DDD (degenerative disc disease), lumbar Patient CT scan of abdomen and pelvis 2023.  On that CT scan he was found to have incidental finding degenerative disc disease all throughout the lumbar spine.  He also had a bulging disc between L1 and L2 potentially causing moderate spinal stenosis.  Patient has had 6 months of chronic back pain with right-sided radiculopathy.  He has tried and failed conservative therapy including Aleve, ibuprofen, and rest.  Therefore he needs an MRI to determine what is causing the nerve impingement.  Meanwhile begin a prednisone taper pack and add gabapentin 300 mg p.o. 3 times daily.  Await the results of the MRI

## 2023-01-20 ENCOUNTER — Ambulatory Visit
Admission: RE | Admit: 2023-01-20 | Discharge: 2023-01-20 | Disposition: A | Discharge status: Home / Self Care | Payer: BC Managed Care – PPO | Source: Ambulatory Visit | Attending: Family Medicine | Admitting: Family Medicine

## 2023-01-20 DIAGNOSIS — G8929 Other chronic pain: Secondary | ICD-10-CM

## 2023-03-02 ENCOUNTER — Ambulatory Visit: Payer: BC Managed Care – PPO | Admitting: Family Medicine

## 2023-03-02 ENCOUNTER — Encounter: Payer: Self-pay | Admitting: Family Medicine

## 2023-03-02 VITALS — BP 138/82 | HR 58 | Temp 98.4°F | Ht 67.0 in | Wt 271.0 lb

## 2023-03-02 DIAGNOSIS — M51362 Other intervertebral disc degeneration, lumbar region with discogenic back pain and lower extremity pain: Secondary | ICD-10-CM | POA: Diagnosis not present

## 2023-03-02 DIAGNOSIS — G8929 Other chronic pain: Secondary | ICD-10-CM

## 2023-03-02 DIAGNOSIS — M5441 Lumbago with sciatica, right side: Secondary | ICD-10-CM

## 2023-03-02 NOTE — Progress Notes (Signed)
Subjective:    Patient ID: Russell Novak [facility], male    DOB: 05-Jun-1970, 52 y.o.   MRN: [id number] 01/05/23 Patient is a very pleasant 52 year old gentleman who has been battling low back pain for 6 months.  He states that in February, he did pain in lower back that radiated into his right gluteus down the right leg all the way to the dorsum of his right foot.  He reports it is a nervelike hyperesthesia and paresthesia.  He states the pain is worse in the morning.  When he gets up and starts getting ready, by the time he is finished his shower he is having severe pain.  He takes ibuprofen for the pain with both slightly but is present with him all day long.  He has tried waiting for 6 months without any benefit.  He denies any bowel or bladder incontinence.  He denies any saddle anesthesia.  Any fevers or chills.  At that time, my plan was: Patient CT scan of abdomen and pelvis 2023.  On that CT scan he was found to have incidental finding degenerative disc disease all throughout the lumbar spine.  He also had a bulging disc between L1 and L2 potentially causing moderate spinal stenosis.  Patient has had 6 months of chronic back pain with right-sided radiculopathy.  He has tried and failed conservative therapy including Aleve, ibuprofen, and rest.  Therefore he needs an MRI to determine what is causing the nerve impingement.  Meanwhile begin a prednisone taper pack and add gabapentin 300 mg p.o. 3 times daily.  Await the results of the MRI  03/02/23 MRI: IMPRESSION: L4-5: Endplate osteophytes and central disc herniation with stenosis that could cause neural compression. Right foraminal to extraforaminal disc herniation could focally compress the exiting right L4 nerve.   L3-4: Disc degeneration with osteophytes and disc bulge. Facet and ligamentous hypertrophy. Lateral recess and foraminal narrowing that could possibly be symptomatic on either or both sides.   L1-2: Central disc herniation with  upward and downward extension. Crowding of the nerve roots at this level that could cause symptoms of stenosis.   L5-S1: Disc bulge. Advanced bilateral facet osteoarthritis. The facet arthritis could be a cause of back pain or referred facet syndrome pain. Patient is here today to review the MRI findings.  He states that the prednisone did not help.  The gabapentin did not help.  However eventually the pain slowly subsided.  Today he is pain-free.  He denies any right-sided radiculopathy at present.  Past Medical History:  Diagnosis Date   Colon cancer (HCC) 2019   in polyp removed during colonoscopy 03/2018, planned sigmoidectomy   GERD (gastroesophageal reflux disease)    occasional   Hypertension    Seizures (HCC)    childhood-none since age of 52 y.o.   Past Surgical History:  Procedure Laterality Date   CHOLECYSTECTOMY N/A 06/16/2021   Procedure: LAPAROSCOPIC CHOLECYSTECTOMY with ICG dye;  Surgeon: Axel Filler, MD;  Location: [facility] OR;  Service: General;  Laterality: N/A;   COLON SURGERY  05/20/2018   18 inches removed    COLONOSCOPY     TONSILLECTOMY     Current Outpatient Medications on File Prior to Visit  Medication Sig Dispense Refill   Ca Carbonate-Mag Hydroxide (ROLAIDS PO) Take by mouth as needed.     gabapentin (NEURONTIN) 300 MG capsule Take 1 capsule (300 mg total) by mouth 3 (three) times daily. 90 capsule 3   hydrochlorothiazide (HYDRODIURIL) 12.5 MG tablet TAKE 1  TABLET BY MOUTH EVERY DAY 90 tablet 0   meloxicam (MOBIC) 15 MG tablet Take 1 tablet (15 mg total) by mouth daily. 30 tablet 5   telmisartan (MICARDIS) 40 MG tablet Take 1 tablet (40 mg total) by mouth daily. 90 tablet 3   No current facility-administered medications on file prior to visit.   No Known Allergies Social History   Socioeconomic History   Marital status: Married    Spouse name: Not on file   Number of children: 2   Years of education: Not on file   Highest education level: Not on  file  Occupational History   Occupation: Autobody Psychologist, forensic  Tobacco Use   Smoking status: Former    Current packs/day: 0.00    Types: Cigarettes    Quit date: 2007    Years since quitting: 17.7   Smokeless tobacco: Never   Tobacco comments:    quit 2005  Vaping Use   Vaping status: Never Used  Substance and Sexual Activity   Alcohol use: Yes    Comment: occasional   Drug use: No   Sexual activity: Yes    Partners: Female  Other Topics Concern   Not on file  Social History Narrative   Not on file   Social Determinants of Health   Financial Resource Strain: Not on file  Food Insecurity: Not on file  Transportation Needs: Not on file  Physical Activity: Not on file  Stress: Not on file  Social Connections: Not on file  Intimate Partner Violence: Not on file   Family History  Problem Relation Age of Onset   Lung cancer Mother    Other Father        neck tumor   Colon polyps Father    Esophageal cancer Paternal Uncle    Breast cancer Paternal Aunt    Prostate cancer Paternal Uncle    Diabetes Paternal Uncle    Stroke Paternal Uncle    Rectal cancer Neg Hx    Colitis Neg Hx    Colon cancer Neg Hx    Stomach cancer Neg Hx   ' Review of Systems  All other systems reviewed and are negative.      Objective:   Physical Exam Vitals reviewed.  Constitutional:      General: He is not in acute distress.    Appearance: He is well-developed. He is not diaphoretic.  HENT:     Head: Normocephalic and atraumatic.     Right Ear: External ear normal.     Left Ear: External ear normal.  Neck:     Thyroid: No thyromegaly.     Vascular: No JVD.     Trachea: No tracheal deviation.  Cardiovascular:     Rate and Rhythm: Normal rate and regular rhythm.     Heart sounds: Normal heart sounds. No murmur heard.    No friction rub. No gallop.  Pulmonary:     Effort: Pulmonary effort is normal. No respiratory distress.     Breath sounds: Normal breath sounds. No stridor. No  wheezing or rales.  Chest:     Chest wall: No tenderness.  Musculoskeletal:     Cervical back: Normal range of motion and neck supple.  Lymphadenopathy:     Cervical: No cervical adenopathy.  Skin:    General: Skin is warm.     Coloration: Skin is not pale.     Findings: No erythema or rash.  Neurological:     Mental Status: He is alert and  oriented to person, place, and time.     Cranial Nerves: No cranial nerve deficit.     Sensory: No sensory deficit.     Motor: No abnormal muscle tone.     Coordination: Coordination normal.     Deep Tendon Reflexes: Reflexes normal.           Assessment & Plan:  Degeneration of intervertebral disc of lumbar region with discogenic back pain and lower extremity pain  Chronic right-sided low back pain with right-sided sciatica Thankfully, his symptoms have finally subsided.  I believe that the offending culprit was a bulging disc between L4 and L5.  We went over his MRI findings in detail.  He also has potential thecal sac indentation between L1 and L2 potentially cause back pain.  However I believe that the foraminal impingement seen at L4-L5 was the source of his right-sided radiculopathy.  We discussed seeing a neurosurgeon however the patient states he is pain-free today.  Therefore I recommended against surgical consultation.  I did recommend weight loss as a means to help prevent this from worsening in the future.  If the pain returns we can consult neurosurgery.

## 2023-03-04 ENCOUNTER — Other Ambulatory Visit: Payer: Self-pay | Admitting: Family Medicine

## 2023-03-04 ENCOUNTER — Telehealth: Payer: Self-pay

## 2023-03-04 MED ORDER — NIRMATRELVIR/RITONAVIR (PAXLOVID)TABLET: 3.0000 | ORAL_TABLET | Freq: Two times a day (BID) | ORAL | 0 refills | Status: AC

## 2023-03-04 NOTE — Telephone Encounter (Signed)
Pt's wife called in stating that pt has tested positive for Covid. Pt is experiencing body aches, chest congestion, headache, and just overall feeling horrible. Pt would like to know if pcp would send in med for Covid to pharmacy please. Please advise.  Cb#: [phone]  [facility] [address]

## 2023-03-04 NOTE — Telephone Encounter (Signed)
Erroneous encounter. Please disregard.

## 2023-03-21 ENCOUNTER — Other Ambulatory Visit: Payer: Self-pay | Admitting: Family Medicine

## 2023-03-21 DIAGNOSIS — I10 Essential (primary) hypertension: Secondary | ICD-10-CM

## 2023-03-22 NOTE — Telephone Encounter (Signed)
Requested Prescriptions  Pending Prescriptions Disp Refills   hydrochlorothiazide (HYDRODIURIL) 12.5 MG tablet [Pharmacy Med Name: HYDROCHLOROTHIAZIDE 12.5 MG TB] 90 tablet 0    Sig: TAKE 1 TABLET BY MOUTH EVERY DAY     Cardiovascular: Diuretics - Thiazide Failed - 03/21/2023  1:59 AM      Failed - Valid encounter within last 6 months    Recent Outpatient Visits           1 year ago Hypertension, unspecified type   [facility] Donita Brooks, MD   4 years ago Benign essential HTN   [facility] Pickard, Priscille Heidelberg, MD   5 years ago General medical exam   [facility] Donita Brooks, MD   7 years ago Low back pain without sciatica, unspecified back pain laterality   [facility] Donita Brooks, MD   8 years ago Vision, loss, sudden, left   [facility] Pickard, Priscille Heidelberg, MD              Passed - Cr in normal range and within 180 days    Creat  Date Value Ref Range Status  11/10/2022 1.03 0.70 - 1.30 mg/dL Final         Passed - K in normal range and within 180 days    Potassium  Date Value Ref Range Status  11/10/2022 4.5 3.5 - 5.3 mmol/L Final         Passed - Na in normal range and within 180 days    Sodium  Date Value Ref Range Status  11/10/2022 141 135 - 146 mmol/L Final         Passed - Last BP in normal range    BP Readings from Last 1 Encounters:  03/02/23 138/82

## 2023-06-22 ENCOUNTER — Other Ambulatory Visit: Payer: Self-pay | Admitting: Family Medicine

## 2023-06-22 DIAGNOSIS — I10 Essential (primary) hypertension: Secondary | ICD-10-CM

## 2023-06-22 NOTE — Telephone Encounter (Signed)
Requested Prescriptions  Pending Prescriptions Disp Refills   telmisartan (MICARDIS) 40 MG tablet [Pharmacy Med Name: TELMISARTAN 40 MG TABLET] 90 tablet 0    Sig: TAKE 1 TABLET BY MOUTH EVERY DAY     Cardiovascular:  Angiotensin Receptor Blockers Failed - 06/22/2023 11:38 AM      Failed - Cr in normal range and within 180 days    Creat  Date Value Ref Range Status  11/10/2022 1.03 0.70 - 1.30 mg/dL Final         Failed - K in normal range and within 180 days    Potassium  Date Value Ref Range Status  11/10/2022 4.5 3.5 - 5.3 mmol/L Final         Failed - Valid encounter within last 6 months    Recent Outpatient Visits           2 years ago Hypertension, unspecified type   [facility] Donita Brooks, MD   4 years ago Benign essential HTN   [facility] Pickard, Priscille Heidelberg, MD   5 years ago General medical exam   [facility] Donita Brooks, MD   8 years ago Low back pain without sciatica, unspecified back pain laterality   [facility] Donita Brooks, MD   9 years ago Vision, loss, sudden, left   [facility] Pickard, Priscille Heidelberg, MD              Passed - Patient is not pregnant      Passed - Last BP in normal range    BP Readings from Last 1 Encounters:  03/02/23 138/82          hydrochlorothiazide (HYDRODIURIL) 12.5 MG tablet [Pharmacy Med Name: HYDROCHLOROTHIAZIDE 12.5 MG TB] 90 tablet 0    Sig: TAKE 1 TABLET BY MOUTH EVERY DAY     Cardiovascular: Diuretics - Thiazide Failed - 06/22/2023 11:38 AM      Failed - Cr in normal range and within 180 days    Creat  Date Value Ref Range Status  11/10/2022 1.03 0.70 - 1.30 mg/dL Final         Failed - K in normal range and within 180 days    Potassium  Date Value Ref Range Status  11/10/2022 4.5 3.5 - 5.3 mmol/L Final         Failed - Na in normal range and within 180 days    Sodium  Date Value Ref Range Status  11/10/2022  141 135 - 146 mmol/L Final         Failed - Valid encounter within last 6 months    Recent Outpatient Visits           2 years ago Hypertension, unspecified type   [facility] Donita Brooks, MD   4 years ago Benign essential HTN   [facility] Tanya Nones, Priscille Heidelberg, MD   5 years ago General medical exam   [facility] Donita Brooks, MD   8 years ago Low back pain without sciatica, unspecified back pain laterality   [facility] Donita Brooks, MD   9 years ago Vision, loss, sudden, left   [facility] Pickard, Priscille Heidelberg, MD              Passed - Last BP in normal range    BP Readings from Last 1 Encounters:  03/02/23  138/82         Patient will need an office visit for further refills.

## 2023-09-23 ENCOUNTER — Other Ambulatory Visit: Payer: Self-pay | Admitting: Family Medicine

## 2023-09-23 DIAGNOSIS — I10 Essential (primary) hypertension: Secondary | ICD-10-CM

## 2023-09-24 NOTE — Telephone Encounter (Signed)
 Requested medication (s) are due for refill today:   Yes  Requested medication (s) are on the active medication list:   Yes  Future visit scheduled:   Yes  Scheduled him for CPE on 11/04/2023 with Dr. Cheril Cork at 8:15.   Last ordered: 06/22/2023 #90, 0 refills for both  Unable to refill because labs and an OV are due.   CPE scheduled.   Provider to review for refills before upcoming appt.  Pt said he would be out before then.   Requested Prescriptions  Pending Prescriptions Disp Refills   telmisartan  (MICARDIS ) 40 MG tablet [Pharmacy Med Name: TELMISARTAN  40 MG TABLET] 90 tablet 0    Sig: TAKE 1 TABLET BY MOUTH EVERY DAY     Cardiovascular:  Angiotensin Receptor Blockers Failed - 09/24/2023  8:16 AM      Failed - Cr in normal range and within 180 days    Creat  Date Value Ref Range Status  11/10/2022 1.03 0.70 - 1.30 mg/dL Final         Failed - K in normal range and within 180 days    Potassium  Date Value Ref Range Status  11/10/2022 4.5 3.5 - 5.3 mmol/L Final         Failed - Valid encounter within last 6 months    Recent Outpatient Visits           6 months ago Degeneration of intervertebral disc of lumbar region with discogenic back pain and lower extremity pain   [facility] Austine Lefort, MD   8 months ago Chronic right-sided low back pain with right-sided sciatica   [place] [facility] Austine Lefort, MD   1 year ago Benign essential HTN   [place] [facility] Austine Lefort, MD   1 year ago Neck pain   [place] [facility] Pickard, Cisco Crest, MD              Passed - Patient is not pregnant      Passed - Last BP in normal range    BP Readings from Last 1 Encounters:  03/02/23 138/82          hydrochlorothiazide  (HYDRODIURIL ) 12.5 MG tablet [Pharmacy Med Name: HYDROCHLOROTHIAZIDE  12.5 MG TB] 90 tablet 0    Sig: TAKE 1 TABLET BY MOUTH EVERY DAY      Cardiovascular: Diuretics - Thiazide Failed - 09/24/2023  8:16 AM      Failed - Cr in normal range and within 180 days    Creat  Date Value Ref Range Status  11/10/2022 1.03 0.70 - 1.30 mg/dL Final         Failed - K in normal range and within 180 days    Potassium  Date Value Ref Range Status  11/10/2022 4.5 3.5 - 5.3 mmol/L Final         Failed - Na in normal range and within 180 days    Sodium  Date Value Ref Range Status  11/10/2022 141 135 - 146 mmol/L Final         Failed - Valid encounter within last 6 months    Recent Outpatient Visits           6 months ago Degeneration of intervertebral disc of lumbar region with discogenic back pain and lower extremity pain   [facility] Austine Lefort, MD   8 months ago Chronic  right-sided low back pain with right-sided sciatica   [place] [facility] Pickard, Cisco Crest, MD   1 year ago Benign essential HTN   [place] [facility] Pickard, Cisco Crest, MD   1 year ago Neck pain    [facility] Austine Lefort, MD              Passed - Last BP in normal range    BP Readings from Last 1 Encounters:  03/02/23 138/82

## 2023-11-04 ENCOUNTER — Ambulatory Visit (INDEPENDENT_AMBULATORY_CARE_PROVIDER_SITE_OTHER): Payer: Self-pay | Admitting: Family Medicine

## 2023-11-04 ENCOUNTER — Other Ambulatory Visit: Payer: Self-pay | Admitting: Family Medicine

## 2023-11-04 VITALS — BP 132/76 | HR 54 | Ht 67.0 in | Wt 268.2 lb

## 2023-11-04 DIAGNOSIS — Z1211 Encounter for screening for malignant neoplasm of colon: Secondary | ICD-10-CM | POA: Diagnosis not present

## 2023-11-04 DIAGNOSIS — Z125 Encounter for screening for malignant neoplasm of prostate: Secondary | ICD-10-CM | POA: Diagnosis not present

## 2023-11-04 DIAGNOSIS — Z0001 Encounter for general adult medical examination with abnormal findings: Secondary | ICD-10-CM | POA: Diagnosis not present

## 2023-11-04 DIAGNOSIS — I10 Essential (primary) hypertension: Secondary | ICD-10-CM

## 2023-11-04 DIAGNOSIS — Z0000 Encounter for general adult medical examination without abnormal findings: Secondary | ICD-10-CM

## 2023-11-04 MED ORDER — MUPIROCIN CALCIUM 2 % EX CREA: 1.0000 | TOPICAL_CREAM | Freq: Two times a day (BID) | CUTANEOUS | 0 refills | Status: AC

## 2023-11-04 NOTE — Progress Notes (Signed)
 Subjective:    Patient ID: Russell Novak, male    DOB: 02/12/1971, 53 y.o.   MRN: [id number]  Patient is here today for complete physical exam.  His last colonoscopy was in 2021.  They recommended a repeat colonoscopy in 3 years.  This is overdue.  He is also due for prostate cancer screening.  His blood pressure today is excellent.  He is due for Pneumovax 23.  He is also due for Shingrix.  Patient politely declines vaccinations.  Tetanus shot is not due again until 2029.  He does have erythema and swelling on the tip of his nose as well as on both cheeks.  There is erythema and erythematous papules.  There are no pustules.  He has no previous history of rosacea.  Differential diagnosis would be erysipelas versus rosacea.  It is very mild today.  He also has a small umbilical hernia  Past Medical History:  Diagnosis Date   Colon cancer (HCC) 2019   in polyp removed during colonoscopy 03/2018, planned sigmoidectomy   GERD (gastroesophageal reflux disease)    occasional   Hypertension    Seizures (HCC)    childhood-none since age of 53 y.o.   Past Surgical History:  Procedure Laterality Date   CHOLECYSTECTOMY N/A 06/16/2021   Procedure: LAPAROSCOPIC CHOLECYSTECTOMY with ICG dye;  Surgeon: Shela Derby, MD;  Location: [facility] OR;  Service: General;  Laterality: N/A;   COLON SURGERY  05/20/2018   18 inches removed    COLONOSCOPY     TONSILLECTOMY     Current Outpatient Medications on File Prior to Visit  Medication Sig Dispense Refill   hydrochlorothiazide  (HYDRODIURIL ) 12.5 MG tablet TAKE 1 TABLET BY MOUTH EVERY DAY 90 tablet 0   telmisartan  (MICARDIS ) 40 MG tablet TAKE 1 TABLET BY MOUTH EVERY DAY 90 tablet 0   No current facility-administered medications on file prior to visit.   No Known Allergies Social History   Socioeconomic History   Marital status: Married    Spouse name: Not on file   Number of children: 2   Years of education: Not on file   Highest education  level: Not on file  Occupational History   Occupation: Autobody Psychologist, forensic  Tobacco Use   Smoking status: Former    Current packs/day: 0.00    Types: Cigarettes    Quit date: 2007    Years since quitting: 18.4   Smokeless tobacco: Never   Tobacco comments:    quit 2005  Vaping Use   Vaping status: Never Used  Substance and Sexual Activity   Alcohol use: Yes    Comment: occasional   Drug use: No   Sexual activity: Yes    Partners: Female  Other Topics Concern   Not on file  Social History Narrative   Not on file   Social Drivers of Health   Financial Resource Strain: Not on file  Food Insecurity: Not on file  Transportation Needs: Not on file  Physical Activity: Not on file  Stress: Not on file  Social Connections: Not on file  Intimate Partner Violence: Not on file   Family History  Problem Relation Age of Onset   Lung cancer Mother    Other Father        neck tumor   Colon polyps Father    Esophageal cancer Paternal Uncle    Breast cancer Paternal Aunt    Prostate cancer Paternal Uncle    Diabetes Paternal Uncle    Stroke Paternal Uncle  Rectal cancer Neg Hx    Colitis Neg Hx    Colon cancer Neg Hx    Stomach cancer Neg Hx   ' He does have a cousin who was recently diagnosed with prostate cancer. Review of Systems  Gastrointestinal:  Positive for anal bleeding and blood in stool.  All other systems reviewed and are negative.      Objective:   Physical Exam Vitals reviewed.  Constitutional:      General: He is not in acute distress.    Appearance: He is well-developed. He is not diaphoretic.  HENT:     Head: Normocephalic and atraumatic.     Right Ear: Tympanic membrane, ear canal and external ear normal.     Left Ear: Tympanic membrane, ear canal and external ear normal.     Nose: Nose normal.     Mouth/Throat:     Pharynx: No oropharyngeal exudate.  Eyes:     General: No scleral icterus.       Right eye: No discharge.        Left eye: No  discharge.     Conjunctiva/sclera: Conjunctivae normal.     Pupils: Pupils are equal, round, and reactive to light.  Neck:     Thyroid: No thyromegaly.     Vascular: No JVD.     Trachea: No tracheal deviation.  Cardiovascular:     Rate and Rhythm: Normal rate and regular rhythm.     Heart sounds: Normal heart sounds. No murmur heard.    No friction rub. No gallop.  Pulmonary:     Effort: Pulmonary effort is normal. No respiratory distress.     Breath sounds: Normal breath sounds. No stridor. No wheezing or rales.  Chest:     Chest wall: No tenderness.  Abdominal:     General: Bowel sounds are normal. There is no distension.     Palpations: Abdomen is soft. There is no mass.     Tenderness: There is no abdominal tenderness. There is no guarding.     Hernia: A hernia is present.  Musculoskeletal:        General: No tenderness or deformity. Normal range of motion.     Cervical back: Normal range of motion and neck supple.  Lymphadenopathy:     Cervical: No cervical adenopathy.  Skin:    General: Skin is warm.     Coloration: Skin is not pale.     Findings: Erythema present. No rash.  Neurological:     Mental Status: He is alert and oriented to person, place, and time.     Cranial Nerves: No cranial nerve deficit.     Sensory: No sensory deficit.     Motor: No abnormal muscle tone.     Coordination: Coordination normal.     Deep Tendon Reflexes: Reflexes normal.           Assessment & Plan:  Benign essential HTN - Plan: CBC with Differential/Platelet, Comprehensive metabolic panel with GFR, Lipid panel  Prostate cancer screening - Plan: PSA  Colon cancer screening - Plan: Ambulatory referral to Gastroenterology  General medical exam Colonoscopy is overdue.  I will schedule the patient for colonoscopy.  The patient prostate cancer with PSA.  Blood pressure is excellent.  I will check a CBC a CMP and a lipid panel.  Patient either has mild rosacea or erysipelas on his  nose and cheeks.  I will treat this with mupirocin ointment twice daily.  Consider doxycycline if not improving.  Patient has small umbilical hernia.  We discussed referral to general surgery but he declines this at the present time.  Recommended Pneumovax 20.  Recommended Shingrix.  Patient politely declines.

## 2023-11-05 ENCOUNTER — Ambulatory Visit: Payer: Self-pay | Admitting: Family Medicine

## 2023-11-05 NOTE — Telephone Encounter (Signed)
 Requested medications are due for refill today.  See note  Requested medications are on the active medications list.  yes  Last refill. 11/04/2023  Future visit scheduled.   no  Notes to clinic.  Pharmacy comment: Alternative Requested:THE PRESCRIBED MEDICATION IS NOT COVERED BY INSURANCE. PLEASE CONSIDER CHANGING TO ONE OF THE SUGGESTED COVERED ALTERNATIVES.    Requested Prescriptions  Pending Prescriptions Disp Refills   mupirocin ointment (BACTROBAN) 2 % [Pharmacy Med Name: MUPIROCIN 2% OINTMENT] 1 g 0     Off-Protocol Failed - 11/05/2023 10:52 AM      Failed - Medication not assigned to a protocol, review manually.      Passed - Valid encounter within last 12 months    Recent Outpatient Visits           Yesterday Benign essential HTN   [place] [facility] Austine Lefort, MD   8 months ago Degeneration of intervertebral disc of lumbar region with discogenic back pain and lower extremity pain   [place] [facility] Austine Lefort, MD   10 months ago Chronic right-sided low back pain with right-sided sciatica   [place] [facility] Medicine Austine Lefort, MD   1 year ago Benign essential HTN   [place] [facility] Cheril Cork, Cisco Crest, MD   1 year ago Neck pain    [facility] Pickard, Cisco Crest, MD

## 2023-11-06 LAB — HEMOGLOBIN A1C
Hgb A1c MFr Bld: 6 % — ABNORMAL HIGH (ref <5.7)
Mean Plasma Glucose: 126 mg/dL
eAG (mmol/L): 7 mmol/L

## 2023-11-06 LAB — CBC WITH DIFFERENTIAL/PLATELET
Absolute Lymphocytes: 2663 {cells}/uL (ref 850–3900)
Absolute Monocytes: 540 {cells}/uL (ref 200–950)
Basophils Absolute: 60 {cells}/uL (ref 0–200)
Basophils Relative: 0.8 %
Eosinophils Absolute: 300 {cells}/uL (ref 15–500)
Eosinophils Relative: 4 %
HCT: 49.8 % (ref 38.5–50.0)
Hemoglobin: 16.1 g/dL (ref 13.2–17.1)
MCH: 28.6 pg (ref 27.0–33.0)
MCHC: 32.3 g/dL (ref 32.0–36.0)
MCV: 88.6 fL (ref 80.0–100.0)
MPV: 10.9 fL (ref 7.5–12.5)
Monocytes Relative: 7.2 %
Neutro Abs: 3938 {cells}/uL (ref 1500–7800)
Neutrophils Relative %: 52.5 %
Platelets: 241 10*3/uL (ref 140–400)
RBC: 5.62 10*6/uL (ref 4.20–5.80)
RDW: 13.2 % (ref 11.0–15.0)
Total Lymphocyte: 35.5 %
WBC: 7.5 10*3/uL (ref 3.8–10.8)

## 2023-11-06 LAB — COMPREHENSIVE METABOLIC PANEL WITH GFR
AG Ratio: 1.6 (calc) (ref 1.0–2.5)
ALT: 33 U/L (ref 9–46)
AST: 18 U/L (ref 10–35)
Albumin: 4.1 g/dL (ref 3.6–5.1)
Alkaline phosphatase (APISO): 58 U/L (ref 35–144)
BUN: 16 mg/dL (ref 7–25)
CO2: 27 mmol/L (ref 20–32)
Calcium: 9.3 mg/dL (ref 8.6–10.3)
Chloride: 105 mmol/L (ref 98–110)
Creat: 1 mg/dL (ref 0.70–1.30)
Globulin: 2.5 g/dL (ref 1.9–3.7)
Glucose, Bld: 115 mg/dL — ABNORMAL HIGH (ref 65–99)
Potassium: 4.5 mmol/L (ref 3.5–5.3)
Sodium: 139 mmol/L (ref 135–146)
Total Bilirubin: 0.4 mg/dL (ref 0.2–1.2)
Total Protein: 6.6 g/dL (ref 6.1–8.1)
eGFR: 89 mL/min/{1.73_m2} (ref >=60)

## 2023-11-06 LAB — LIPID PANEL
Cholesterol: 202 mg/dL — ABNORMAL HIGH (ref <200)
HDL: 53 mg/dL (ref >=40)
LDL Cholesterol (Calc): 134 mg/dL — ABNORMAL HIGH
Non-HDL Cholesterol (Calc): 149 mg/dL — ABNORMAL HIGH (ref <130)
Total CHOL/HDL Ratio: 3.8 (calc) (ref <5.0)
Triglycerides: 62 mg/dL (ref <150)

## 2023-11-06 LAB — TEST AUTHORIZATION

## 2023-11-06 LAB — PSA: PSA: 0.43 ng/mL (ref <=4.00)

## 2023-11-08 ENCOUNTER — Other Ambulatory Visit: Payer: Self-pay

## 2023-11-08 ENCOUNTER — Telehealth: Payer: Self-pay

## 2023-11-08 MED ORDER — ROSUVASTATIN CALCIUM 10 MG PO TABS: 10.0000 mg | ORAL_TABLET | Freq: Every day | ORAL | 3 refills | Status: DC

## 2023-11-08 NOTE — Telephone Encounter (Signed)
 Copied from CRM [id number]. Topic: Clinical - Prescription Issue >> Nov 08, 2023 10:28 AM Pennelope Bowler wrote: Reason for CRM: Patient's prescription of mupirocin  cream (BACTROBAN ) 2 % [id number], [place] - [address] last week, but insurance does not cover the cream version of this medication, only the other version. Patient/Pharmacy requesting that the other form (ointment) is sent over so that it can be filled. Spoke to Wife, Merced Hanners.

## 2023-11-09 ENCOUNTER — Other Ambulatory Visit: Payer: Self-pay | Admitting: Family Medicine

## 2023-11-09 MED ORDER — MUPIROCIN 2 % EX OINT: 1.0000 | TOPICAL_OINTMENT | Freq: Two times a day (BID) | CUTANEOUS | 0 refills | Status: AC

## 2023-11-09 MED ORDER — DOXYCYCLINE HYCLATE 100 MG PO TABS: 100.0000 mg | ORAL_TABLET | Freq: Two times a day (BID) | ORAL | 0 refills | Status: AC

## 2023-11-09 NOTE — Telephone Encounter (Signed)
 Pt. Has been notified by phone. He states he understands medication change and will contact us  if he incurs any issues .

## 2023-11-19 ENCOUNTER — Telehealth: Payer: Self-pay | Admitting: Gastroenterology

## 2023-11-19 DIAGNOSIS — K6389 Other specified diseases of intestine: Secondary | ICD-10-CM

## 2023-11-19 NOTE — Telephone Encounter (Signed)
 CT scan order entered and sent to the schedulers   Left message on machine to call back

## 2023-11-19 NOTE — Telephone Encounter (Signed)
 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  He is overdue for a surveillance colonoscopy with me (last in Jan 2021), and I saw him today when he accompanied his wife for a procedure.  [facility] staff have set him up with a pre-visit for his colonoscopy.  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  Please contact him to arrange the scan.  Thanks  - H. Danis

## 2023-11-22 NOTE — Telephone Encounter (Signed)
 I was able to reach the pt by phone and advise him that a CT is needed. He will expect a call from the schedulers. I also provided him with the phone number to call if more convenient.

## 2023-12-21 ENCOUNTER — Encounter

## 2023-12-24 ENCOUNTER — Other Ambulatory Visit: Payer: Self-pay | Admitting: Family Medicine

## 2023-12-24 DIAGNOSIS — I10 Essential (primary) hypertension: Secondary | ICD-10-CM

## 2024-01-11 ENCOUNTER — Encounter: Admitting: Gastroenterology

## 2024-01-17 ENCOUNTER — Ambulatory Visit (HOSPITAL_COMMUNITY)
Admission: RE | Admit: 2024-01-17 | Discharge: 2024-01-17 | Disposition: A | Discharge status: Home / Self Care | Source: Ambulatory Visit | Attending: Gastroenterology | Admitting: Gastroenterology

## 2024-01-17 DIAGNOSIS — K6389 Other specified diseases of intestine: Secondary | ICD-10-CM | POA: Insufficient documentation

## 2024-01-17 MED ORDER — IOHEXOL 9 MG/ML PO SOLN
500.0000 mL | ORAL | Status: AC
  Administered 2024-01-17 (×2): 500 mL via ORAL

## 2024-01-17 MED ORDER — IOHEXOL 300 MG/ML  SOLN
100.0000 mL | Freq: Once | INTRAMUSCULAR | Status: AC | PRN
  Administered 2024-01-17: 100 mL via INTRAVENOUS

## 2024-01-28 ENCOUNTER — Ambulatory Visit: Admitting: *Deleted

## 2024-01-28 ENCOUNTER — Encounter: Payer: Self-pay | Admitting: Gastroenterology

## 2024-01-28 VITALS — Ht 67.0 in | Wt 260.0 lb

## 2024-01-28 DIAGNOSIS — Z85038 Personal history of other malignant neoplasm of large intestine: Secondary | ICD-10-CM

## 2024-01-28 MED ORDER — NA SULFATE-K SULFATE-MG SULF 17.5-3.13-1.6 GM/177ML PO SOLN
1.0000 | Freq: Once | ORAL | 0 refills | Status: AC
Start: 2024-01-28 — End: 2024-01-28

## 2024-01-28 NOTE — Progress Notes (Signed)
 Pt's name and DOB verified at the beginning of the pre-visit with 2 identifiers  Pt denies any difficulty with ambulating,sitting, laying down or rolling side to side  Pt has no issues moving head neck or swallowing  No egg or soy allergy known to patient   No issues known to pt with past sedation  No FH of Malignant Hyperthermia  Pt is not on home 02   Pt is not on blood thinners   Pt has frequent issues with constipation RN instructed pt to use Miralax per bottles instructions a week before prep days. Pt states they will  Pt is not on dialysis  Pt denise any abnormal heart rhythms   Pt denies any upcoming cardiac testing  Patient's chart reviewed by Norleen Schillings CNRA prior to pre-visit and patient appropriate for the [facility].  Pre-visit completed and red dot placed by patient's name on their procedure day (on provider's schedule).    Visit by phone  Pt states weight is 260 lb  Pt given  both [facility] main # and MD on call # prior to instructions.  Informed pt to come in at the time discussed and is shown on PV instructions.  Pt instructed to use [employer] or GoodRx for a price reduction on prep  Instructed pt to review instructions again prior to procedure and call main # given if has any questions or any issues. Pt states they will. Instructed pt where to find PV instructions in My Chart  Instructed pt on all aspects of written instructions including med holds clothing to wear and foods to eat and not eat as well as after procedure legal restrictions and to call MD on call if needed.. Pt states understanding.

## 2024-01-29 ENCOUNTER — Other Ambulatory Visit: Payer: Self-pay | Admitting: Gastroenterology

## 2024-01-29 DIAGNOSIS — K6389 Other specified diseases of intestine: Secondary | ICD-10-CM

## 2024-02-07 ENCOUNTER — Ambulatory Visit: Payer: Self-pay | Admitting: Gastroenterology

## 2024-02-08 ENCOUNTER — Other Ambulatory Visit: Payer: Self-pay | Admitting: Family Medicine

## 2024-02-08 DIAGNOSIS — I10 Essential (primary) hypertension: Secondary | ICD-10-CM

## 2024-02-14 ENCOUNTER — Encounter: Payer: Self-pay | Admitting: Gastroenterology

## 2024-02-14 ENCOUNTER — Ambulatory Visit (AMBULATORY_SURGERY_CENTER): Admitting: Gastroenterology

## 2024-02-14 VITALS — BP 104/67 | HR 60 | Temp 97.5°F | Resp 15 | Ht 67.0 in | Wt 260.0 lb

## 2024-02-14 DIAGNOSIS — D124 Benign neoplasm of descending colon: Secondary | ICD-10-CM

## 2024-02-14 DIAGNOSIS — D123 Benign neoplasm of transverse colon: Secondary | ICD-10-CM | POA: Diagnosis not present

## 2024-02-14 DIAGNOSIS — Z85038 Personal history of other malignant neoplasm of large intestine: Secondary | ICD-10-CM | POA: Diagnosis not present

## 2024-02-14 DIAGNOSIS — L818 Other specified disorders of pigmentation: Secondary | ICD-10-CM | POA: Diagnosis not present

## 2024-02-14 DIAGNOSIS — K648 Other hemorrhoids: Secondary | ICD-10-CM | POA: Diagnosis not present

## 2024-02-14 DIAGNOSIS — Z1211 Encounter for screening for malignant neoplasm of colon: Secondary | ICD-10-CM | POA: Diagnosis present

## 2024-02-14 DIAGNOSIS — D125 Benign neoplasm of sigmoid colon: Secondary | ICD-10-CM | POA: Diagnosis not present

## 2024-02-14 DIAGNOSIS — Z980 Intestinal bypass and anastomosis status: Secondary | ICD-10-CM

## 2024-02-14 DIAGNOSIS — Z860100 Personal history of colon polyps, unspecified: Secondary | ICD-10-CM

## 2024-02-14 MED ORDER — SODIUM CHLORIDE 0.9 % IV SOLN: 500.0000 mL | INTRAVENOUS | Status: DC

## 2024-02-14 NOTE — Progress Notes (Signed)
 Called to room to assist during endoscopic procedure.  Patient ID and intended procedure confirmed with present staff. Received instructions for my participation in the procedure from the performing physician.

## 2024-02-14 NOTE — Progress Notes (Signed)
 History and Physical:  This patient presents for endoscopic testing for: Encounter Diagnosis  Name Primary?   Personal history of colon cancer Yes    Surveillance colonoscopy today for Hx CRC - sigmoid colon resected in Dec 2019 (neg lymph nodes)  No polyps Jan 2021  Patient denies chronic abdominal pain, rectal bleeding, constipation or diarrhea.   Patient is otherwise without complaints or active issues today.   Past Medical History: Past Medical History:  Diagnosis Date   Colon cancer (HCC) 2019   in polyp removed during colonoscopy 03/2018, planned sigmoidectomy   GERD (gastroesophageal reflux disease)    occasional   Hypertension    Seizures (HCC)    childhood-none since age of 53 y.o.     Past Surgical History: Past Surgical History:  Procedure Laterality Date   CHOLECYSTECTOMY N/A 06/16/2021   Procedure: LAPAROSCOPIC CHOLECYSTECTOMY with ICG dye;  Surgeon: Rubin Calamity, MD;  Location: [facility] OR;  Service: General;  Laterality: N/A;   COLON SURGERY  05/20/2018   18 inches removed    COLONOSCOPY     TONSILLECTOMY      Allergies: No Known Allergies  Outpatient Meds: Current Outpatient Medications  Medication Sig Dispense Refill   hydrochlorothiazide  (HYDRODIURIL ) 12.5 MG tablet TAKE 1 TABLET BY MOUTH EVERY DAY 90 tablet 0   doxycycline  (VIBRA -TABS) 100 MG tablet Take 1 tablet (100 mg total) by mouth 2 (two) times daily. (Patient not taking: Reported on 02/14/2024) 14 tablet 0   mupirocin  cream (BACTROBAN ) 2 % Apply 1 Application topically 2 (two) times daily. 15 g 0   mupirocin  ointment (BACTROBAN ) 2 % Apply 1 Application topically 2 (two) times daily. (Patient not taking: Reported on 01/28/2024) 22 g 0   rosuvastatin  (CRESTOR ) 10 MG tablet Take 1 tablet (10 mg total) by mouth daily. (Patient not taking: Reported on 02/14/2024) 90 tablet 3   telmisartan  (MICARDIS ) 40 MG tablet TAKE 1 TABLET BY MOUTH EVERY DAY (Patient not taking: Reported on 02/14/2024) 90 tablet 0    Current Facility-Administered Medications  Medication Dose Route Frequency Provider Last Rate Last Admin   0.9 %  sodium chloride  infusion  500 mL Intravenous Continuous Danis, Victory CROME III, MD          ___________________________________________________________________ Objective   Exam:  BP (!) 142/63   Pulse 71   Temp (!) 97.5 F (36.4 C) (Temporal)   Ht 5' 7 (1.702 m)   Wt 260 lb (117.9 kg)   SpO2 96%   BMI 40.72 kg/m   CV: regular , S1/S2 Resp: clear to auscultation bilaterally, normal RR and effort noted GI: soft, no tenderness, with active bowel sounds.  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   Personal history of colon cancer Yes     Plan: Colonoscopy   The benefits and risks of the planned procedure(s) were described in detail with the patient or (when appropriate) their health care proxy.  Risks were outlined as including, but not limited to, bleeding, infection, perforation, adverse medication reaction leading to cardiac or pulmonary decompensation, pancreatitis (if ERCP).  The limitation of incomplete mucosal visualization was also discussed.  No guarantees or warranties were given.  The patient is appropriate for an endoscopic procedure in the ambulatory setting.   - Victory Brand, MD

## 2024-02-14 NOTE — Op Note (Signed)
 [facility] Patient Name: Russell Novak Procedure Date: 02/14/2024 12:54 PM MRN: [id number] Endoscopist: Victory L. Legrand , MD, [id number] Age: 53 Referring MD:  Date of Birth: 11-05-1970 Gender: Male Account #: [id number] Procedure:                Colonoscopy Indications:              High risk colon cancer surveillance: Personal                            history of colonic polyps and personal history of                            colon cancer                           Sigmoid resection for colorectal cancer December                            2019 (negative lymph nodes), additional adenomas on                            that exam                           No polyps January 2021 Medicines:                Monitored Anesthesia Care Procedure:                Pre-Anesthesia Assessment:                           - Prior to the procedure, a History and Physical                            was performed, and patient medications and                            allergies were reviewed. The patient's tolerance of                            previous anesthesia was also reviewed. The risks                            and benefits of the procedure and the sedation                            options and risks were discussed with the patient.                            All questions were answered, and informed consent                            was obtained. Prior Anticoagulants: The patient has                            taken  no anticoagulant or antiplatelet agents. ASA                            Grade Assessment: II - A patient with mild systemic                            disease. After reviewing the risks and benefits,                            the patient was deemed in satisfactory condition to                            undergo the procedure.                           After obtaining informed consent, the colonoscope                            was passed under direct vision.  Throughout the                            procedure, the patient's blood pressure, pulse, and                            oxygen saturations were monitored continuously. The                            Olympus CF-HQ190L ([id number]) Colonoscope was                            introduced through the anus and advanced to the the                            cecum, identified by appendiceal orifice and                            ileocecal valve. The colonoscopy was performed                            without difficulty. The patient tolerated the                            procedure well. The quality of the bowel                            preparation was excellent. The ileocecal valve,                            appendiceal orifice, and rectum were photographed. Scope In: 1:29:10 PM Scope Out: 1:47:39 PM Scope Withdrawal Time: 0 hours 14 minutes 48 seconds  Total Procedure Duration: 0 hours 18 minutes 29 seconds  Findings:                 The perianal and digital rectal examinations were  normal.                           Repeat examination of right colon under NBI                            performed.                           A 6 mm polyp was found in the mid transverse colon.                            The polyp was sessile. The polyp was removed with a                            cold snare. Resection and retrieval were complete.                           A tattoo was seen in the distal transverse colon.                            The tattoo site appeared normal, with no recurrent                            polyp in the area. (Site of a 2019 pedunculated                            polyp proximal to the malignancy)                           A polyp was found in the descending colon. The                            polyp was sessile. The polyp was removed with a                            cold snare. Resection and retrieval were complete.                           There  was evidence of a prior end-to-end                            colo-rectal anastomosis in the proximal rectum.                            This was patent and was characterized by healthy                            appearing mucosa. The anastomosis was easily                            traversed.  Internal hemorrhoids were found. The hemorrhoids                            were medium-sized. Complications:            No immediate complications. Estimated Blood Loss:     Estimated blood loss was minimal. Impression:               - One 6 mm polyp in the mid transverse colon,                            removed with a cold snare. Resected and retrieved.                           - A tattoo was seen in the distal transverse colon.                            The tattoo site appeared normal.                           - One polyp in the descending colon, removed with a                            cold snare. Resected and retrieved.                           - Patent end-to-end colo-rectal anastomosis,                            characterized by healthy appearing mucosa.                           - Internal hemorrhoids. Recommendation:           - Patient has a contact number available for                            emergencies. The signs and symptoms of potential                            delayed complications were discussed with the                            patient. Return to normal activities tomorrow.                            Written discharge instructions were provided to the                            patient.                           - Resume previous diet.                           - Continue present medications.                           -  Await pathology results.                           - Repeat colonoscopy in 3 years for surveillance. Taffie Eckmann L. Legrand, MD 02/14/2024 1:57:28 PM This report has been signed electronically.

## 2024-02-14 NOTE — Patient Instructions (Addendum)

## 2024-02-14 NOTE — Progress Notes (Signed)
 Report given to PACU, vss

## 2024-02-14 NOTE — Progress Notes (Signed)
 Pt's states no medical or surgical changes since previsit or office visit.

## 2024-02-15 ENCOUNTER — Telehealth: Payer: Self-pay

## 2024-02-15 NOTE — Telephone Encounter (Signed)
 No answer, left message to call if having any issues or concerns, B.Vester Titsworth RN

## 2024-02-17 ENCOUNTER — Ambulatory Visit: Payer: Self-pay | Admitting: Gastroenterology

## 2024-02-17 LAB — SURGICAL PATHOLOGY

## 2024-02-29 ENCOUNTER — Ambulatory Visit (HOSPITAL_BASED_OUTPATIENT_CLINIC_OR_DEPARTMENT_OTHER)
Admission: RE | Admit: 2024-02-29 | Discharge: 2024-02-29 | Disposition: A | Discharge status: Home / Self Care | Payer: Self-pay | Source: Ambulatory Visit | Attending: Family Medicine | Admitting: Family Medicine

## 2024-02-29 DIAGNOSIS — I10 Essential (primary) hypertension: Secondary | ICD-10-CM | POA: Insufficient documentation

## 2024-03-02 ENCOUNTER — Ambulatory Visit: Payer: Self-pay | Admitting: Family Medicine

## 2024-03-02 ENCOUNTER — Other Ambulatory Visit: Payer: Self-pay

## 2024-03-02 MED ORDER — ROSUVASTATIN CALCIUM 20 MG PO TABS: 20.0000 mg | ORAL_TABLET | Freq: Every day | ORAL | 3 refills | Status: AC

## 2024-03-24 ENCOUNTER — Other Ambulatory Visit: Payer: Self-pay | Admitting: Family Medicine

## 2024-03-24 DIAGNOSIS — I10 Essential (primary) hypertension: Secondary | ICD-10-CM

## 2024-06-23 ENCOUNTER — Other Ambulatory Visit: Payer: Self-pay | Admitting: Family Medicine

## 2024-06-23 DIAGNOSIS — I10 Essential (primary) hypertension: Secondary | ICD-10-CM
